# Patient Record
Sex: Female | Born: 1945
Health system: Southern US, Community
[De-identification: ages and names within clinical notes are randomized; demographics above are authoritative.]

## PROBLEM LIST (undated history)

## (undated) DIAGNOSIS — N763 Subacute and chronic vulvitis: Secondary | ICD-10-CM

## (undated) DIAGNOSIS — K635 Polyp of colon: Secondary | ICD-10-CM

## (undated) DIAGNOSIS — C4490 Unspecified malignant neoplasm of skin, unspecified: Secondary | ICD-10-CM

## (undated) DIAGNOSIS — K449 Diaphragmatic hernia without obstruction or gangrene: Secondary | ICD-10-CM

## (undated) DIAGNOSIS — K2970 Gastritis, unspecified, without bleeding: Secondary | ICD-10-CM

## (undated) DIAGNOSIS — E785 Hyperlipidemia, unspecified: Secondary | ICD-10-CM

## (undated) DIAGNOSIS — R55 Syncope and collapse: Secondary | ICD-10-CM

## (undated) DIAGNOSIS — N393 Stress incontinence (female) (male): Secondary | ICD-10-CM

## (undated) DIAGNOSIS — D649 Anemia, unspecified: Secondary | ICD-10-CM

## (undated) DIAGNOSIS — I10 Essential (primary) hypertension: Secondary | ICD-10-CM

## (undated) DIAGNOSIS — I739 Peripheral vascular disease, unspecified: Secondary | ICD-10-CM

## (undated) DIAGNOSIS — M71129 Other infective bursitis, unspecified elbow: Secondary | ICD-10-CM

## (undated) DIAGNOSIS — F329 Major depressive disorder, single episode, unspecified: Secondary | ICD-10-CM

## (undated) DIAGNOSIS — E119 Type 2 diabetes mellitus without complications: Secondary | ICD-10-CM

## (undated) DIAGNOSIS — G629 Polyneuropathy, unspecified: Secondary | ICD-10-CM

## (undated) DIAGNOSIS — K219 Gastro-esophageal reflux disease without esophagitis: Secondary | ICD-10-CM

## (undated) DIAGNOSIS — G4733 Obstructive sleep apnea (adult) (pediatric): Secondary | ICD-10-CM

## (undated) DIAGNOSIS — M1990 Unspecified osteoarthritis, unspecified site: Secondary | ICD-10-CM

## (undated) DIAGNOSIS — G4730 Sleep apnea, unspecified: Secondary | ICD-10-CM

## (undated) DIAGNOSIS — G250 Essential tremor: Secondary | ICD-10-CM

## (undated) HISTORY — DX: Other infective bursitis, unspecified elbow: M71.129

## (undated) HISTORY — DX: Stress incontinence (female) (male): N39.3

## (undated) HISTORY — DX: Polyneuropathy, unspecified: G62.9

## (undated) HISTORY — DX: Diaphragmatic hernia without obstruction or gangrene: K44.9

## (undated) HISTORY — PX: ABDOMINAL HYSTERECTOMY: SUR658

## (undated) HISTORY — DX: Gastritis, unspecified, without bleeding: K29.70

## (undated) HISTORY — DX: Unspecified osteoarthritis, unspecified site: M19.90

## (undated) HISTORY — PX: CHOLECYSTECTOMY: SHX55

## (undated) HISTORY — DX: Peripheral vascular disease, unspecified: I73.9

## (undated) HISTORY — DX: Obstructive sleep apnea (adult) (pediatric): G47.33

## (undated) HISTORY — DX: Syncope and collapse: R55

## (undated) HISTORY — PX: BLADDER SURGERY: SHX569

## (undated) HISTORY — PX: PARAESOPHAGEAL HERNIA REPAIR: SHX2161

## (undated) HISTORY — DX: Major depressive disorder, single episode, unspecified: F32.9

## (undated) HISTORY — DX: Unspecified malignant neoplasm of skin, unspecified: C44.90

## (undated) HISTORY — DX: Essential (primary) hypertension: I10

## (undated) HISTORY — PX: UPPER GASTROINTESTINAL ENDOSCOPY: SHX188

## (undated) HISTORY — PX: RECTOCELE REPAIR: SHX761

## (undated) HISTORY — DX: Subacute and chronic vulvitis: N76.3

## (undated) HISTORY — DX: Hyperlipidemia, unspecified: E78.5

## (undated) HISTORY — DX: Gastro-esophageal reflux disease without esophagitis: K21.9

## (undated) HISTORY — DX: Type 2 diabetes mellitus without complications: E11.9

## (undated) HISTORY — DX: Polyp of colon: K63.5

## (undated) HISTORY — DX: Anemia, unspecified: D64.9

---
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
## 2008-07-19 DIAGNOSIS — G4730 Sleep apnea, unspecified: Secondary | ICD-10-CM | POA: Insufficient documentation

## 2008-07-19 HISTORY — DX: Sleep apnea, unspecified: G47.30

## 2010-07-07 ENCOUNTER — Ambulatory Visit: Payer: Self-pay | Admitting: Unknown Physician Specialty

## 2011-09-13 DIAGNOSIS — Z0000 Encounter for general adult medical examination without abnormal findings: Secondary | ICD-10-CM | POA: Diagnosis not present

## 2011-09-28 DIAGNOSIS — G250 Essential tremor: Secondary | ICD-10-CM | POA: Diagnosis not present

## 2011-09-28 DIAGNOSIS — R5381 Other malaise: Secondary | ICD-10-CM | POA: Diagnosis not present

## 2011-09-28 DIAGNOSIS — G252 Other specified forms of tremor: Secondary | ICD-10-CM | POA: Diagnosis not present

## 2011-09-28 DIAGNOSIS — E669 Obesity, unspecified: Secondary | ICD-10-CM | POA: Diagnosis not present

## 2011-10-08 DIAGNOSIS — E782 Mixed hyperlipidemia: Secondary | ICD-10-CM | POA: Diagnosis not present

## 2011-10-08 DIAGNOSIS — I10 Essential (primary) hypertension: Secondary | ICD-10-CM | POA: Diagnosis not present

## 2011-10-08 DIAGNOSIS — F329 Major depressive disorder, single episode, unspecified: Secondary | ICD-10-CM | POA: Diagnosis not present

## 2011-11-12 DIAGNOSIS — Z01419 Encounter for gynecological examination (general) (routine) without abnormal findings: Secondary | ICD-10-CM | POA: Diagnosis not present

## 2011-11-12 DIAGNOSIS — Z124 Encounter for screening for malignant neoplasm of cervix: Secondary | ICD-10-CM | POA: Diagnosis not present

## 2011-11-15 DIAGNOSIS — N951 Menopausal and female climacteric states: Secondary | ICD-10-CM | POA: Diagnosis not present

## 2011-11-17 DIAGNOSIS — Z1231 Encounter for screening mammogram for malignant neoplasm of breast: Secondary | ICD-10-CM | POA: Diagnosis not present

## 2011-11-18 DIAGNOSIS — M899 Disorder of bone, unspecified: Secondary | ICD-10-CM | POA: Diagnosis not present

## 2011-11-18 DIAGNOSIS — M949 Disorder of cartilage, unspecified: Secondary | ICD-10-CM | POA: Diagnosis not present

## 2011-11-23 DIAGNOSIS — J029 Acute pharyngitis, unspecified: Secondary | ICD-10-CM | POA: Diagnosis not present

## 2012-03-02 DIAGNOSIS — G250 Essential tremor: Secondary | ICD-10-CM | POA: Diagnosis not present

## 2012-03-02 DIAGNOSIS — G252 Other specified forms of tremor: Secondary | ICD-10-CM | POA: Diagnosis not present

## 2012-04-11 DIAGNOSIS — M19019 Primary osteoarthritis, unspecified shoulder: Secondary | ICD-10-CM | POA: Diagnosis not present

## 2012-04-11 DIAGNOSIS — M47814 Spondylosis without myelopathy or radiculopathy, thoracic region: Secondary | ICD-10-CM | POA: Diagnosis not present

## 2012-04-11 DIAGNOSIS — I10 Essential (primary) hypertension: Secondary | ICD-10-CM | POA: Diagnosis not present

## 2012-04-11 DIAGNOSIS — IMO0002 Reserved for concepts with insufficient information to code with codable children: Secondary | ICD-10-CM | POA: Diagnosis not present

## 2012-04-11 DIAGNOSIS — M47812 Spondylosis without myelopathy or radiculopathy, cervical region: Secondary | ICD-10-CM | POA: Diagnosis not present

## 2012-04-14 DIAGNOSIS — M6281 Muscle weakness (generalized): Secondary | ICD-10-CM | POA: Diagnosis not present

## 2012-04-14 DIAGNOSIS — M47812 Spondylosis without myelopathy or radiculopathy, cervical region: Secondary | ICD-10-CM | POA: Diagnosis not present

## 2012-04-14 DIAGNOSIS — IMO0001 Reserved for inherently not codable concepts without codable children: Secondary | ICD-10-CM | POA: Diagnosis not present

## 2012-04-14 DIAGNOSIS — M542 Cervicalgia: Secondary | ICD-10-CM | POA: Diagnosis not present

## 2012-04-25 DIAGNOSIS — R1310 Dysphagia, unspecified: Secondary | ICD-10-CM | POA: Diagnosis not present

## 2012-04-25 DIAGNOSIS — IMO0002 Reserved for concepts with insufficient information to code with codable children: Secondary | ICD-10-CM | POA: Diagnosis not present

## 2012-04-25 DIAGNOSIS — M47814 Spondylosis without myelopathy or radiculopathy, thoracic region: Secondary | ICD-10-CM | POA: Diagnosis not present

## 2012-04-26 DIAGNOSIS — M47814 Spondylosis without myelopathy or radiculopathy, thoracic region: Secondary | ICD-10-CM | POA: Diagnosis not present

## 2012-04-26 DIAGNOSIS — M6281 Muscle weakness (generalized): Secondary | ICD-10-CM | POA: Diagnosis not present

## 2012-04-26 DIAGNOSIS — M47812 Spondylosis without myelopathy or radiculopathy, cervical region: Secondary | ICD-10-CM | POA: Diagnosis not present

## 2012-04-26 DIAGNOSIS — M5124 Other intervertebral disc displacement, thoracic region: Secondary | ICD-10-CM | POA: Diagnosis not present

## 2012-04-26 DIAGNOSIS — M542 Cervicalgia: Secondary | ICD-10-CM | POA: Diagnosis not present

## 2012-04-26 DIAGNOSIS — IMO0001 Reserved for inherently not codable concepts without codable children: Secondary | ICD-10-CM | POA: Diagnosis not present

## 2012-04-26 DIAGNOSIS — IMO0002 Reserved for concepts with insufficient information to code with codable children: Secondary | ICD-10-CM | POA: Diagnosis not present

## 2012-04-28 DIAGNOSIS — R1314 Dysphagia, pharyngoesophageal phase: Secondary | ICD-10-CM | POA: Diagnosis not present

## 2012-04-28 DIAGNOSIS — K449 Diaphragmatic hernia without obstruction or gangrene: Secondary | ICD-10-CM | POA: Diagnosis not present

## 2012-04-28 DIAGNOSIS — K224 Dyskinesia of esophagus: Secondary | ICD-10-CM | POA: Diagnosis not present

## 2012-04-28 HISTORY — PX: ESOPHAGOGASTRODUODENOSCOPY: SHX1529

## 2012-05-02 DIAGNOSIS — M545 Low back pain: Secondary | ICD-10-CM | POA: Diagnosis not present

## 2012-05-02 DIAGNOSIS — M542 Cervicalgia: Secondary | ICD-10-CM | POA: Diagnosis not present

## 2012-05-02 DIAGNOSIS — M47814 Spondylosis without myelopathy or radiculopathy, thoracic region: Secondary | ICD-10-CM | POA: Diagnosis not present

## 2012-05-02 DIAGNOSIS — IMO0002 Reserved for concepts with insufficient information to code with codable children: Secondary | ICD-10-CM | POA: Diagnosis not present

## 2012-05-02 DIAGNOSIS — M47812 Spondylosis without myelopathy or radiculopathy, cervical region: Secondary | ICD-10-CM | POA: Diagnosis not present

## 2012-05-02 DIAGNOSIS — M6281 Muscle weakness (generalized): Secondary | ICD-10-CM | POA: Diagnosis not present

## 2012-05-02 DIAGNOSIS — IMO0001 Reserved for inherently not codable concepts without codable children: Secondary | ICD-10-CM | POA: Diagnosis not present

## 2012-05-05 DIAGNOSIS — IMO0001 Reserved for inherently not codable concepts without codable children: Secondary | ICD-10-CM | POA: Diagnosis not present

## 2012-05-05 DIAGNOSIS — M47812 Spondylosis without myelopathy or radiculopathy, cervical region: Secondary | ICD-10-CM | POA: Diagnosis not present

## 2012-05-05 DIAGNOSIS — IMO0002 Reserved for concepts with insufficient information to code with codable children: Secondary | ICD-10-CM | POA: Diagnosis not present

## 2012-05-05 DIAGNOSIS — M47814 Spondylosis without myelopathy or radiculopathy, thoracic region: Secondary | ICD-10-CM | POA: Diagnosis not present

## 2012-05-05 DIAGNOSIS — M6281 Muscle weakness (generalized): Secondary | ICD-10-CM | POA: Diagnosis not present

## 2012-05-05 DIAGNOSIS — M542 Cervicalgia: Secondary | ICD-10-CM | POA: Diagnosis not present

## 2012-05-08 DIAGNOSIS — IMO0001 Reserved for inherently not codable concepts without codable children: Secondary | ICD-10-CM | POA: Diagnosis not present

## 2012-05-08 DIAGNOSIS — IMO0002 Reserved for concepts with insufficient information to code with codable children: Secondary | ICD-10-CM | POA: Diagnosis not present

## 2012-05-08 DIAGNOSIS — M542 Cervicalgia: Secondary | ICD-10-CM | POA: Diagnosis not present

## 2012-05-08 DIAGNOSIS — M47812 Spondylosis without myelopathy or radiculopathy, cervical region: Secondary | ICD-10-CM | POA: Diagnosis not present

## 2012-05-08 DIAGNOSIS — M6281 Muscle weakness (generalized): Secondary | ICD-10-CM | POA: Diagnosis not present

## 2012-05-08 DIAGNOSIS — M47814 Spondylosis without myelopathy or radiculopathy, thoracic region: Secondary | ICD-10-CM | POA: Diagnosis not present

## 2012-05-11 DIAGNOSIS — IMO0002 Reserved for concepts with insufficient information to code with codable children: Secondary | ICD-10-CM | POA: Diagnosis not present

## 2012-05-11 DIAGNOSIS — M47814 Spondylosis without myelopathy or radiculopathy, thoracic region: Secondary | ICD-10-CM | POA: Diagnosis not present

## 2012-05-11 DIAGNOSIS — M542 Cervicalgia: Secondary | ICD-10-CM | POA: Diagnosis not present

## 2012-05-11 DIAGNOSIS — M6281 Muscle weakness (generalized): Secondary | ICD-10-CM | POA: Diagnosis not present

## 2012-05-11 DIAGNOSIS — M47812 Spondylosis without myelopathy or radiculopathy, cervical region: Secondary | ICD-10-CM | POA: Diagnosis not present

## 2012-05-11 DIAGNOSIS — IMO0001 Reserved for inherently not codable concepts without codable children: Secondary | ICD-10-CM | POA: Diagnosis not present

## 2012-05-18 DIAGNOSIS — M47814 Spondylosis without myelopathy or radiculopathy, thoracic region: Secondary | ICD-10-CM | POA: Diagnosis not present

## 2012-05-18 DIAGNOSIS — M542 Cervicalgia: Secondary | ICD-10-CM | POA: Diagnosis not present

## 2012-05-18 DIAGNOSIS — M6281 Muscle weakness (generalized): Secondary | ICD-10-CM | POA: Diagnosis not present

## 2012-05-18 DIAGNOSIS — IMO0001 Reserved for inherently not codable concepts without codable children: Secondary | ICD-10-CM | POA: Diagnosis not present

## 2012-05-18 DIAGNOSIS — M47812 Spondylosis without myelopathy or radiculopathy, cervical region: Secondary | ICD-10-CM | POA: Diagnosis not present

## 2012-05-18 DIAGNOSIS — IMO0002 Reserved for concepts with insufficient information to code with codable children: Secondary | ICD-10-CM | POA: Diagnosis not present

## 2012-05-22 DIAGNOSIS — M542 Cervicalgia: Secondary | ICD-10-CM | POA: Diagnosis not present

## 2012-05-24 DIAGNOSIS — M542 Cervicalgia: Secondary | ICD-10-CM | POA: Diagnosis not present

## 2012-05-29 DIAGNOSIS — K2960 Other gastritis without bleeding: Secondary | ICD-10-CM | POA: Diagnosis not present

## 2012-05-29 DIAGNOSIS — M542 Cervicalgia: Secondary | ICD-10-CM | POA: Diagnosis not present

## 2012-05-29 DIAGNOSIS — K5900 Constipation, unspecified: Secondary | ICD-10-CM | POA: Diagnosis not present

## 2012-05-30 DIAGNOSIS — M545 Low back pain: Secondary | ICD-10-CM | POA: Diagnosis not present

## 2012-05-31 DIAGNOSIS — M542 Cervicalgia: Secondary | ICD-10-CM | POA: Diagnosis not present

## 2012-06-02 DIAGNOSIS — Z23 Encounter for immunization: Secondary | ICD-10-CM | POA: Diagnosis not present

## 2012-06-05 DIAGNOSIS — M542 Cervicalgia: Secondary | ICD-10-CM | POA: Diagnosis not present

## 2012-06-20 DIAGNOSIS — M546 Pain in thoracic spine: Secondary | ICD-10-CM | POA: Diagnosis not present

## 2012-06-29 DIAGNOSIS — H8110 Benign paroxysmal vertigo, unspecified ear: Secondary | ICD-10-CM | POA: Diagnosis not present

## 2012-06-29 DIAGNOSIS — Z6830 Body mass index (BMI) 30.0-30.9, adult: Secondary | ICD-10-CM | POA: Diagnosis not present

## 2012-06-29 DIAGNOSIS — M949 Disorder of cartilage, unspecified: Secondary | ICD-10-CM | POA: Diagnosis not present

## 2012-06-29 DIAGNOSIS — R51 Headache: Secondary | ICD-10-CM | POA: Diagnosis not present

## 2012-06-29 DIAGNOSIS — M899 Disorder of bone, unspecified: Secondary | ICD-10-CM | POA: Diagnosis not present

## 2012-07-01 DIAGNOSIS — R220 Localized swelling, mass and lump, head: Secondary | ICD-10-CM | POA: Diagnosis not present

## 2012-07-01 DIAGNOSIS — R42 Dizziness and giddiness: Secondary | ICD-10-CM | POA: Diagnosis not present

## 2012-07-01 DIAGNOSIS — R221 Localized swelling, mass and lump, neck: Secondary | ICD-10-CM | POA: Diagnosis not present

## 2012-07-01 DIAGNOSIS — R51 Headache: Secondary | ICD-10-CM | POA: Diagnosis not present

## 2012-11-22 DIAGNOSIS — H9120 Sudden idiopathic hearing loss, unspecified ear: Secondary | ICD-10-CM | POA: Diagnosis not present

## 2012-11-22 DIAGNOSIS — H905 Unspecified sensorineural hearing loss: Secondary | ICD-10-CM | POA: Diagnosis not present

## 2012-12-06 DIAGNOSIS — H905 Unspecified sensorineural hearing loss: Secondary | ICD-10-CM | POA: Diagnosis not present

## 2012-12-06 DIAGNOSIS — H9120 Sudden idiopathic hearing loss, unspecified ear: Secondary | ICD-10-CM | POA: Diagnosis not present

## 2012-12-14 DIAGNOSIS — A09 Infectious gastroenteritis and colitis, unspecified: Secondary | ICD-10-CM | POA: Diagnosis not present

## 2012-12-14 DIAGNOSIS — Z6830 Body mass index (BMI) 30.0-30.9, adult: Secondary | ICD-10-CM | POA: Diagnosis not present

## 2012-12-19 ENCOUNTER — Ambulatory Visit: Payer: Self-pay | Admitting: Unknown Physician Specialty

## 2012-12-19 DIAGNOSIS — H9120 Sudden idiopathic hearing loss, unspecified ear: Secondary | ICD-10-CM | POA: Diagnosis not present

## 2012-12-19 DIAGNOSIS — Z1389 Encounter for screening for other disorder: Secondary | ICD-10-CM | POA: Diagnosis not present

## 2012-12-19 DIAGNOSIS — H9190 Unspecified hearing loss, unspecified ear: Secondary | ICD-10-CM | POA: Diagnosis not present

## 2012-12-19 LAB — CREATININE, SERUM
EGFR (African American): 42 — ABNORMAL LOW
EGFR (Non-African Amer.): 37 — ABNORMAL LOW

## 2013-04-30 DIAGNOSIS — K219 Gastro-esophageal reflux disease without esophagitis: Secondary | ICD-10-CM | POA: Diagnosis not present

## 2013-04-30 DIAGNOSIS — Z86010 Personal history of colonic polyps: Secondary | ICD-10-CM | POA: Diagnosis not present

## 2013-05-28 DIAGNOSIS — Z86010 Personal history of colonic polyps: Secondary | ICD-10-CM | POA: Diagnosis not present

## 2013-05-28 HISTORY — PX: COLONOSCOPY: SHX174

## 2013-06-01 DIAGNOSIS — K4090 Unilateral inguinal hernia, without obstruction or gangrene, not specified as recurrent: Secondary | ICD-10-CM | POA: Diagnosis not present

## 2013-06-01 DIAGNOSIS — K4020 Bilateral inguinal hernia, without obstruction or gangrene, not specified as recurrent: Secondary | ICD-10-CM | POA: Diagnosis not present

## 2013-06-01 DIAGNOSIS — R109 Unspecified abdominal pain: Secondary | ICD-10-CM | POA: Diagnosis not present

## 2013-06-01 DIAGNOSIS — K449 Diaphragmatic hernia without obstruction or gangrene: Secondary | ICD-10-CM | POA: Diagnosis not present

## 2013-06-04 DIAGNOSIS — R1032 Left lower quadrant pain: Secondary | ICD-10-CM | POA: Diagnosis not present

## 2013-06-22 DIAGNOSIS — D509 Iron deficiency anemia, unspecified: Secondary | ICD-10-CM | POA: Diagnosis not present

## 2013-06-22 DIAGNOSIS — R7301 Impaired fasting glucose: Secondary | ICD-10-CM | POA: Diagnosis not present

## 2013-06-22 DIAGNOSIS — D539 Nutritional anemia, unspecified: Secondary | ICD-10-CM | POA: Diagnosis not present

## 2013-06-22 DIAGNOSIS — I10 Essential (primary) hypertension: Secondary | ICD-10-CM | POA: Diagnosis not present

## 2013-06-22 DIAGNOSIS — R7309 Other abnormal glucose: Secondary | ICD-10-CM | POA: Diagnosis not present

## 2013-06-22 DIAGNOSIS — C4490 Unspecified malignant neoplasm of skin, unspecified: Secondary | ICD-10-CM | POA: Diagnosis not present

## 2013-07-02 DIAGNOSIS — E119 Type 2 diabetes mellitus without complications: Secondary | ICD-10-CM | POA: Diagnosis not present

## 2013-07-04 DIAGNOSIS — D509 Iron deficiency anemia, unspecified: Secondary | ICD-10-CM | POA: Diagnosis not present

## 2013-07-04 DIAGNOSIS — Z6832 Body mass index (BMI) 32.0-32.9, adult: Secondary | ICD-10-CM | POA: Diagnosis not present

## 2013-07-04 DIAGNOSIS — E119 Type 2 diabetes mellitus without complications: Secondary | ICD-10-CM | POA: Diagnosis not present

## 2013-07-04 DIAGNOSIS — E785 Hyperlipidemia, unspecified: Secondary | ICD-10-CM | POA: Diagnosis not present

## 2013-07-10 DIAGNOSIS — Z1231 Encounter for screening mammogram for malignant neoplasm of breast: Secondary | ICD-10-CM | POA: Diagnosis not present

## 2013-07-11 DIAGNOSIS — Z1231 Encounter for screening mammogram for malignant neoplasm of breast: Secondary | ICD-10-CM | POA: Diagnosis not present

## 2013-07-25 DIAGNOSIS — K219 Gastro-esophageal reflux disease without esophagitis: Secondary | ICD-10-CM | POA: Diagnosis not present

## 2013-07-25 DIAGNOSIS — D509 Iron deficiency anemia, unspecified: Secondary | ICD-10-CM | POA: Diagnosis not present

## 2013-07-25 DIAGNOSIS — K5900 Constipation, unspecified: Secondary | ICD-10-CM | POA: Diagnosis not present

## 2013-07-30 DIAGNOSIS — G609 Hereditary and idiopathic neuropathy, unspecified: Secondary | ICD-10-CM | POA: Diagnosis not present

## 2013-07-30 DIAGNOSIS — I10 Essential (primary) hypertension: Secondary | ICD-10-CM | POA: Diagnosis not present

## 2013-07-30 DIAGNOSIS — E119 Type 2 diabetes mellitus without complications: Secondary | ICD-10-CM | POA: Diagnosis not present

## 2013-07-30 DIAGNOSIS — K219 Gastro-esophageal reflux disease without esophagitis: Secondary | ICD-10-CM | POA: Diagnosis not present

## 2013-07-30 DIAGNOSIS — Z713 Dietary counseling and surveillance: Secondary | ICD-10-CM | POA: Diagnosis not present

## 2013-07-30 DIAGNOSIS — E785 Hyperlipidemia, unspecified: Secondary | ICD-10-CM | POA: Diagnosis not present

## 2013-07-30 DIAGNOSIS — Z79899 Other long term (current) drug therapy: Secondary | ICD-10-CM | POA: Diagnosis not present

## 2013-07-30 DIAGNOSIS — D509 Iron deficiency anemia, unspecified: Secondary | ICD-10-CM | POA: Diagnosis not present

## 2013-08-02 DIAGNOSIS — D649 Anemia, unspecified: Secondary | ICD-10-CM | POA: Diagnosis not present

## 2013-08-03 DIAGNOSIS — D509 Iron deficiency anemia, unspecified: Secondary | ICD-10-CM | POA: Diagnosis not present

## 2013-08-03 DIAGNOSIS — K449 Diaphragmatic hernia without obstruction or gangrene: Secondary | ICD-10-CM | POA: Diagnosis not present

## 2013-08-03 DIAGNOSIS — Z79899 Other long term (current) drug therapy: Secondary | ICD-10-CM | POA: Diagnosis not present

## 2013-08-03 DIAGNOSIS — K219 Gastro-esophageal reflux disease without esophagitis: Secondary | ICD-10-CM | POA: Diagnosis not present

## 2013-08-03 DIAGNOSIS — D131 Benign neoplasm of stomach: Secondary | ICD-10-CM | POA: Diagnosis not present

## 2013-08-03 DIAGNOSIS — D500 Iron deficiency anemia secondary to blood loss (chronic): Secondary | ICD-10-CM | POA: Diagnosis not present

## 2013-08-03 DIAGNOSIS — K2970 Gastritis, unspecified, without bleeding: Secondary | ICD-10-CM | POA: Diagnosis not present

## 2013-08-03 DIAGNOSIS — G609 Hereditary and idiopathic neuropathy, unspecified: Secondary | ICD-10-CM | POA: Diagnosis not present

## 2013-08-03 DIAGNOSIS — E119 Type 2 diabetes mellitus without complications: Secondary | ICD-10-CM | POA: Diagnosis not present

## 2013-08-03 DIAGNOSIS — K2990 Gastroduodenitis, unspecified, without bleeding: Secondary | ICD-10-CM | POA: Diagnosis not present

## 2013-08-07 DIAGNOSIS — D509 Iron deficiency anemia, unspecified: Secondary | ICD-10-CM | POA: Diagnosis not present

## 2013-08-17 DIAGNOSIS — E119 Type 2 diabetes mellitus without complications: Secondary | ICD-10-CM | POA: Diagnosis not present

## 2013-08-30 DIAGNOSIS — K2971 Gastritis, unspecified, with bleeding: Secondary | ICD-10-CM | POA: Diagnosis not present

## 2013-08-30 DIAGNOSIS — D500 Iron deficiency anemia secondary to blood loss (chronic): Secondary | ICD-10-CM | POA: Diagnosis not present

## 2013-08-30 DIAGNOSIS — Z09 Encounter for follow-up examination after completed treatment for conditions other than malignant neoplasm: Secondary | ICD-10-CM | POA: Diagnosis not present

## 2013-08-30 DIAGNOSIS — K2991 Gastroduodenitis, unspecified, with bleeding: Secondary | ICD-10-CM | POA: Diagnosis not present

## 2013-09-03 DIAGNOSIS — Z86010 Personal history of colonic polyps: Secondary | ICD-10-CM | POA: Diagnosis not present

## 2013-09-03 DIAGNOSIS — K2970 Gastritis, unspecified, without bleeding: Secondary | ICD-10-CM | POA: Diagnosis not present

## 2013-09-03 DIAGNOSIS — D509 Iron deficiency anemia, unspecified: Secondary | ICD-10-CM | POA: Diagnosis not present

## 2013-09-03 DIAGNOSIS — K219 Gastro-esophageal reflux disease without esophagitis: Secondary | ICD-10-CM | POA: Diagnosis not present

## 2013-10-04 DIAGNOSIS — E785 Hyperlipidemia, unspecified: Secondary | ICD-10-CM | POA: Diagnosis not present

## 2013-10-04 DIAGNOSIS — Z9181 History of falling: Secondary | ICD-10-CM | POA: Diagnosis not present

## 2013-10-04 DIAGNOSIS — E119 Type 2 diabetes mellitus without complications: Secondary | ICD-10-CM | POA: Diagnosis not present

## 2013-10-04 DIAGNOSIS — D509 Iron deficiency anemia, unspecified: Secondary | ICD-10-CM | POA: Diagnosis not present

## 2013-10-04 DIAGNOSIS — Z6831 Body mass index (BMI) 31.0-31.9, adult: Secondary | ICD-10-CM | POA: Diagnosis not present

## 2013-10-04 DIAGNOSIS — Z1331 Encounter for screening for depression: Secondary | ICD-10-CM | POA: Diagnosis not present

## 2013-10-15 DIAGNOSIS — IMO0002 Reserved for concepts with insufficient information to code with codable children: Secondary | ICD-10-CM | POA: Diagnosis not present

## 2013-10-15 DIAGNOSIS — M1710 Unilateral primary osteoarthritis, unspecified knee: Secondary | ICD-10-CM | POA: Diagnosis not present

## 2013-10-15 DIAGNOSIS — M25569 Pain in unspecified knee: Secondary | ICD-10-CM | POA: Diagnosis not present

## 2013-10-24 DIAGNOSIS — D649 Anemia, unspecified: Secondary | ICD-10-CM | POA: Diagnosis not present

## 2013-10-24 DIAGNOSIS — D509 Iron deficiency anemia, unspecified: Secondary | ICD-10-CM | POA: Diagnosis not present

## 2014-01-07 DIAGNOSIS — Z6832 Body mass index (BMI) 32.0-32.9, adult: Secondary | ICD-10-CM | POA: Diagnosis not present

## 2014-01-07 DIAGNOSIS — D509 Iron deficiency anemia, unspecified: Secondary | ICD-10-CM | POA: Diagnosis not present

## 2014-01-07 DIAGNOSIS — E1149 Type 2 diabetes mellitus with other diabetic neurological complication: Secondary | ICD-10-CM | POA: Diagnosis not present

## 2014-01-07 DIAGNOSIS — E785 Hyperlipidemia, unspecified: Secondary | ICD-10-CM | POA: Diagnosis not present

## 2014-03-07 DIAGNOSIS — C4442 Squamous cell carcinoma of skin of scalp and neck: Secondary | ICD-10-CM | POA: Diagnosis not present

## 2014-03-07 DIAGNOSIS — L570 Actinic keratosis: Secondary | ICD-10-CM | POA: Diagnosis not present

## 2014-03-13 DIAGNOSIS — C4441 Basal cell carcinoma of skin of scalp and neck: Secondary | ICD-10-CM | POA: Diagnosis not present

## 2014-03-28 DIAGNOSIS — L821 Other seborrheic keratosis: Secondary | ICD-10-CM | POA: Diagnosis not present

## 2014-03-28 DIAGNOSIS — C4441 Basal cell carcinoma of skin of scalp and neck: Secondary | ICD-10-CM | POA: Diagnosis not present

## 2014-03-28 DIAGNOSIS — L820 Inflamed seborrheic keratosis: Secondary | ICD-10-CM | POA: Diagnosis not present

## 2014-04-01 DIAGNOSIS — N390 Urinary tract infection, site not specified: Secondary | ICD-10-CM | POA: Diagnosis not present

## 2014-04-01 DIAGNOSIS — N3941 Urge incontinence: Secondary | ICD-10-CM | POA: Diagnosis not present

## 2014-04-04 DIAGNOSIS — M5137 Other intervertebral disc degeneration, lumbosacral region: Secondary | ICD-10-CM | POA: Diagnosis not present

## 2014-04-04 DIAGNOSIS — M47817 Spondylosis without myelopathy or radiculopathy, lumbosacral region: Secondary | ICD-10-CM | POA: Diagnosis not present

## 2014-04-15 DIAGNOSIS — D509 Iron deficiency anemia, unspecified: Secondary | ICD-10-CM | POA: Diagnosis not present

## 2014-04-15 DIAGNOSIS — K5900 Constipation, unspecified: Secondary | ICD-10-CM | POA: Diagnosis not present

## 2014-04-15 DIAGNOSIS — Z6832 Body mass index (BMI) 32.0-32.9, adult: Secondary | ICD-10-CM | POA: Diagnosis not present

## 2014-04-15 DIAGNOSIS — E785 Hyperlipidemia, unspecified: Secondary | ICD-10-CM | POA: Diagnosis not present

## 2014-04-15 DIAGNOSIS — E1149 Type 2 diabetes mellitus with other diabetic neurological complication: Secondary | ICD-10-CM | POA: Diagnosis not present

## 2014-04-15 DIAGNOSIS — N318 Other neuromuscular dysfunction of bladder: Secondary | ICD-10-CM | POA: Diagnosis not present

## 2014-04-15 DIAGNOSIS — I10 Essential (primary) hypertension: Secondary | ICD-10-CM | POA: Diagnosis not present

## 2014-04-17 DIAGNOSIS — G603 Idiopathic progressive neuropathy: Secondary | ICD-10-CM | POA: Diagnosis not present

## 2014-04-29 DIAGNOSIS — K5900 Constipation, unspecified: Secondary | ICD-10-CM | POA: Diagnosis not present

## 2014-04-29 DIAGNOSIS — N3281 Overactive bladder: Secondary | ICD-10-CM | POA: Diagnosis not present

## 2014-04-29 DIAGNOSIS — Z23 Encounter for immunization: Secondary | ICD-10-CM | POA: Diagnosis not present

## 2014-05-02 DIAGNOSIS — R200 Anesthesia of skin: Secondary | ICD-10-CM | POA: Diagnosis not present

## 2014-05-17 DIAGNOSIS — Z13820 Encounter for screening for osteoporosis: Secondary | ICD-10-CM | POA: Diagnosis not present

## 2014-05-17 DIAGNOSIS — M8588 Other specified disorders of bone density and structure, other site: Secondary | ICD-10-CM | POA: Diagnosis not present

## 2014-06-05 DIAGNOSIS — G250 Essential tremor: Secondary | ICD-10-CM | POA: Diagnosis not present

## 2014-06-05 DIAGNOSIS — G609 Hereditary and idiopathic neuropathy, unspecified: Secondary | ICD-10-CM | POA: Diagnosis not present

## 2014-06-06 DIAGNOSIS — N952 Postmenopausal atrophic vaginitis: Secondary | ICD-10-CM | POA: Diagnosis not present

## 2014-06-06 DIAGNOSIS — N3281 Overactive bladder: Secondary | ICD-10-CM | POA: Diagnosis not present

## 2014-07-23 DIAGNOSIS — Z6832 Body mass index (BMI) 32.0-32.9, adult: Secondary | ICD-10-CM | POA: Diagnosis not present

## 2014-07-23 DIAGNOSIS — I10 Essential (primary) hypertension: Secondary | ICD-10-CM | POA: Diagnosis not present

## 2014-07-23 DIAGNOSIS — E1140 Type 2 diabetes mellitus with diabetic neuropathy, unspecified: Secondary | ICD-10-CM | POA: Diagnosis not present

## 2014-07-23 DIAGNOSIS — Z1231 Encounter for screening mammogram for malignant neoplasm of breast: Secondary | ICD-10-CM | POA: Diagnosis not present

## 2014-07-23 DIAGNOSIS — E611 Iron deficiency: Secondary | ICD-10-CM | POA: Diagnosis not present

## 2014-07-23 DIAGNOSIS — E785 Hyperlipidemia, unspecified: Secondary | ICD-10-CM | POA: Diagnosis not present

## 2014-07-30 DIAGNOSIS — Z1231 Encounter for screening mammogram for malignant neoplasm of breast: Secondary | ICD-10-CM | POA: Diagnosis not present

## 2014-08-15 DIAGNOSIS — E119 Type 2 diabetes mellitus without complications: Secondary | ICD-10-CM | POA: Diagnosis not present

## 2014-08-26 DIAGNOSIS — H903 Sensorineural hearing loss, bilateral: Secondary | ICD-10-CM | POA: Diagnosis not present

## 2014-10-31 DIAGNOSIS — I10 Essential (primary) hypertension: Secondary | ICD-10-CM | POA: Diagnosis not present

## 2014-10-31 DIAGNOSIS — Z1389 Encounter for screening for other disorder: Secondary | ICD-10-CM | POA: Diagnosis not present

## 2014-10-31 DIAGNOSIS — E1142 Type 2 diabetes mellitus with diabetic polyneuropathy: Secondary | ICD-10-CM | POA: Diagnosis not present

## 2014-10-31 DIAGNOSIS — Z9181 History of falling: Secondary | ICD-10-CM | POA: Diagnosis not present

## 2014-10-31 DIAGNOSIS — Z6832 Body mass index (BMI) 32.0-32.9, adult: Secondary | ICD-10-CM | POA: Diagnosis not present

## 2014-10-31 DIAGNOSIS — E785 Hyperlipidemia, unspecified: Secondary | ICD-10-CM | POA: Diagnosis not present

## 2014-10-31 DIAGNOSIS — E611 Iron deficiency: Secondary | ICD-10-CM | POA: Diagnosis not present

## 2014-10-31 DIAGNOSIS — Z23 Encounter for immunization: Secondary | ICD-10-CM | POA: Diagnosis not present

## 2014-11-20 DIAGNOSIS — G609 Hereditary and idiopathic neuropathy, unspecified: Secondary | ICD-10-CM | POA: Diagnosis not present

## 2015-01-04 DIAGNOSIS — Z6831 Body mass index (BMI) 31.0-31.9, adult: Secondary | ICD-10-CM | POA: Diagnosis not present

## 2015-01-04 DIAGNOSIS — S80869A Insect bite (nonvenomous), unspecified lower leg, initial encounter: Secondary | ICD-10-CM | POA: Diagnosis not present

## 2015-01-31 DIAGNOSIS — K5909 Other constipation: Secondary | ICD-10-CM | POA: Diagnosis not present

## 2015-01-31 DIAGNOSIS — Z6831 Body mass index (BMI) 31.0-31.9, adult: Secondary | ICD-10-CM | POA: Diagnosis not present

## 2015-01-31 DIAGNOSIS — N8110 Cystocele, unspecified: Secondary | ICD-10-CM | POA: Diagnosis not present

## 2015-02-06 DIAGNOSIS — Z8719 Personal history of other diseases of the digestive system: Secondary | ICD-10-CM | POA: Diagnosis not present

## 2015-02-06 DIAGNOSIS — G609 Hereditary and idiopathic neuropathy, unspecified: Secondary | ICD-10-CM | POA: Diagnosis not present

## 2015-02-06 DIAGNOSIS — Z8669 Personal history of other diseases of the nervous system and sense organs: Secondary | ICD-10-CM | POA: Diagnosis not present

## 2015-02-06 DIAGNOSIS — N816 Rectocele: Secondary | ICD-10-CM | POA: Diagnosis not present

## 2015-02-07 DIAGNOSIS — E785 Hyperlipidemia, unspecified: Secondary | ICD-10-CM | POA: Diagnosis not present

## 2015-02-07 DIAGNOSIS — K5909 Other constipation: Secondary | ICD-10-CM | POA: Diagnosis not present

## 2015-02-07 DIAGNOSIS — E611 Iron deficiency: Secondary | ICD-10-CM | POA: Diagnosis not present

## 2015-02-07 DIAGNOSIS — E1142 Type 2 diabetes mellitus with diabetic polyneuropathy: Secondary | ICD-10-CM | POA: Diagnosis not present

## 2015-02-07 DIAGNOSIS — Z6830 Body mass index (BMI) 30.0-30.9, adult: Secondary | ICD-10-CM | POA: Diagnosis not present

## 2015-02-07 DIAGNOSIS — I10 Essential (primary) hypertension: Secondary | ICD-10-CM | POA: Diagnosis not present

## 2015-02-07 DIAGNOSIS — N816 Rectocele: Secondary | ICD-10-CM | POA: Diagnosis not present

## 2015-02-20 DIAGNOSIS — N8111 Cystocele, midline: Secondary | ICD-10-CM | POA: Diagnosis not present

## 2015-02-20 DIAGNOSIS — N816 Rectocele: Secondary | ICD-10-CM | POA: Diagnosis not present

## 2015-03-07 DIAGNOSIS — N816 Rectocele: Secondary | ICD-10-CM | POA: Diagnosis not present

## 2015-03-07 DIAGNOSIS — M8580 Other specified disorders of bone density and structure, unspecified site: Secondary | ICD-10-CM | POA: Diagnosis not present

## 2015-03-07 DIAGNOSIS — N761 Subacute and chronic vaginitis: Secondary | ICD-10-CM | POA: Diagnosis not present

## 2015-04-02 DIAGNOSIS — R5383 Other fatigue: Secondary | ICD-10-CM | POA: Diagnosis not present

## 2015-04-02 DIAGNOSIS — Z9181 History of falling: Secondary | ICD-10-CM | POA: Diagnosis not present

## 2015-04-02 DIAGNOSIS — E559 Vitamin D deficiency, unspecified: Secondary | ICD-10-CM | POA: Diagnosis not present

## 2015-04-02 DIAGNOSIS — G4733 Obstructive sleep apnea (adult) (pediatric): Secondary | ICD-10-CM | POA: Diagnosis not present

## 2015-04-02 DIAGNOSIS — E538 Deficiency of other specified B group vitamins: Secondary | ICD-10-CM | POA: Diagnosis not present

## 2015-04-02 DIAGNOSIS — D509 Iron deficiency anemia, unspecified: Secondary | ICD-10-CM | POA: Diagnosis not present

## 2015-04-25 DIAGNOSIS — N179 Acute kidney failure, unspecified: Secondary | ICD-10-CM | POA: Diagnosis not present

## 2015-04-25 DIAGNOSIS — R0902 Hypoxemia: Secondary | ICD-10-CM | POA: Diagnosis not present

## 2015-04-25 DIAGNOSIS — R071 Chest pain on breathing: Secondary | ICD-10-CM | POA: Diagnosis not present

## 2015-04-25 DIAGNOSIS — R072 Precordial pain: Secondary | ICD-10-CM | POA: Diagnosis not present

## 2015-04-25 DIAGNOSIS — K219 Gastro-esophageal reflux disease without esophagitis: Secondary | ICD-10-CM | POA: Diagnosis not present

## 2015-04-25 DIAGNOSIS — K449 Diaphragmatic hernia without obstruction or gangrene: Secondary | ICD-10-CM | POA: Diagnosis not present

## 2015-04-25 DIAGNOSIS — Z7982 Long term (current) use of aspirin: Secondary | ICD-10-CM | POA: Diagnosis not present

## 2015-04-25 DIAGNOSIS — Z79899 Other long term (current) drug therapy: Secondary | ICD-10-CM | POA: Diagnosis not present

## 2015-04-25 DIAGNOSIS — R079 Chest pain, unspecified: Secondary | ICD-10-CM | POA: Diagnosis not present

## 2015-04-25 DIAGNOSIS — I10 Essential (primary) hypertension: Secondary | ICD-10-CM | POA: Diagnosis not present

## 2015-04-25 DIAGNOSIS — I249 Acute ischemic heart disease, unspecified: Secondary | ICD-10-CM | POA: Diagnosis not present

## 2015-04-26 DIAGNOSIS — K449 Diaphragmatic hernia without obstruction or gangrene: Secondary | ICD-10-CM | POA: Diagnosis not present

## 2015-04-26 DIAGNOSIS — R0609 Other forms of dyspnea: Secondary | ICD-10-CM | POA: Diagnosis not present

## 2015-04-26 DIAGNOSIS — I10 Essential (primary) hypertension: Secondary | ICD-10-CM | POA: Diagnosis not present

## 2015-04-26 DIAGNOSIS — R0602 Shortness of breath: Secondary | ICD-10-CM | POA: Diagnosis not present

## 2015-04-26 DIAGNOSIS — N179 Acute kidney failure, unspecified: Secondary | ICD-10-CM | POA: Diagnosis not present

## 2015-04-26 DIAGNOSIS — R079 Chest pain, unspecified: Secondary | ICD-10-CM | POA: Diagnosis not present

## 2015-04-29 DIAGNOSIS — R079 Chest pain, unspecified: Secondary | ICD-10-CM | POA: Diagnosis not present

## 2015-04-29 DIAGNOSIS — Z23 Encounter for immunization: Secondary | ICD-10-CM | POA: Diagnosis not present

## 2015-04-29 DIAGNOSIS — K449 Diaphragmatic hernia without obstruction or gangrene: Secondary | ICD-10-CM | POA: Diagnosis not present

## 2015-04-29 DIAGNOSIS — Z6829 Body mass index (BMI) 29.0-29.9, adult: Secondary | ICD-10-CM | POA: Diagnosis not present

## 2015-04-29 DIAGNOSIS — N3281 Overactive bladder: Secondary | ICD-10-CM | POA: Diagnosis not present

## 2015-04-30 DIAGNOSIS — K449 Diaphragmatic hernia without obstruction or gangrene: Secondary | ICD-10-CM | POA: Diagnosis not present

## 2015-04-30 DIAGNOSIS — R079 Chest pain, unspecified: Secondary | ICD-10-CM | POA: Diagnosis not present

## 2015-04-30 DIAGNOSIS — I10 Essential (primary) hypertension: Secondary | ICD-10-CM | POA: Diagnosis not present

## 2015-04-30 DIAGNOSIS — E785 Hyperlipidemia, unspecified: Secondary | ICD-10-CM | POA: Diagnosis not present

## 2015-05-05 DIAGNOSIS — I10 Essential (primary) hypertension: Secondary | ICD-10-CM | POA: Diagnosis not present

## 2015-05-05 DIAGNOSIS — K440 Diaphragmatic hernia with obstruction, without gangrene: Secondary | ICD-10-CM | POA: Diagnosis not present

## 2015-05-05 DIAGNOSIS — K449 Diaphragmatic hernia without obstruction or gangrene: Secondary | ICD-10-CM | POA: Diagnosis not present

## 2015-05-05 DIAGNOSIS — G629 Polyneuropathy, unspecified: Secondary | ICD-10-CM | POA: Diagnosis not present

## 2015-05-05 DIAGNOSIS — K5900 Constipation, unspecified: Secondary | ICD-10-CM | POA: Diagnosis not present

## 2015-05-05 DIAGNOSIS — D174 Benign lipomatous neoplasm of intrathoracic organs: Secondary | ICD-10-CM | POA: Diagnosis not present

## 2015-05-05 DIAGNOSIS — K219 Gastro-esophageal reflux disease without esophagitis: Secondary | ICD-10-CM | POA: Diagnosis not present

## 2015-05-05 DIAGNOSIS — E119 Type 2 diabetes mellitus without complications: Secondary | ICD-10-CM | POA: Diagnosis not present

## 2015-05-05 DIAGNOSIS — Z79899 Other long term (current) drug therapy: Secondary | ICD-10-CM | POA: Diagnosis not present

## 2015-05-06 DIAGNOSIS — D174 Benign lipomatous neoplasm of intrathoracic organs: Secondary | ICD-10-CM | POA: Diagnosis not present

## 2015-05-06 DIAGNOSIS — E119 Type 2 diabetes mellitus without complications: Secondary | ICD-10-CM | POA: Diagnosis not present

## 2015-05-06 DIAGNOSIS — K440 Diaphragmatic hernia with obstruction, without gangrene: Secondary | ICD-10-CM | POA: Diagnosis not present

## 2015-05-06 DIAGNOSIS — Z79899 Other long term (current) drug therapy: Secondary | ICD-10-CM | POA: Diagnosis not present

## 2015-05-06 DIAGNOSIS — I10 Essential (primary) hypertension: Secondary | ICD-10-CM | POA: Diagnosis not present

## 2015-05-06 DIAGNOSIS — K219 Gastro-esophageal reflux disease without esophagitis: Secondary | ICD-10-CM | POA: Diagnosis not present

## 2015-05-21 DIAGNOSIS — Z789 Other specified health status: Secondary | ICD-10-CM | POA: Diagnosis not present

## 2015-05-21 DIAGNOSIS — G609 Hereditary and idiopathic neuropathy, unspecified: Secondary | ICD-10-CM | POA: Diagnosis not present

## 2015-06-19 DIAGNOSIS — R739 Hyperglycemia, unspecified: Secondary | ICD-10-CM | POA: Diagnosis not present

## 2015-07-01 DIAGNOSIS — H43393 Other vitreous opacities, bilateral: Secondary | ICD-10-CM | POA: Diagnosis not present

## 2015-07-08 DIAGNOSIS — C44319 Basal cell carcinoma of skin of other parts of face: Secondary | ICD-10-CM | POA: Diagnosis not present

## 2015-07-20 DIAGNOSIS — E119 Type 2 diabetes mellitus without complications: Secondary | ICD-10-CM

## 2015-07-20 HISTORY — DX: Type 2 diabetes mellitus without complications: E11.9

## 2015-08-21 DIAGNOSIS — Z6828 Body mass index (BMI) 28.0-28.9, adult: Secondary | ICD-10-CM | POA: Diagnosis not present

## 2015-08-21 DIAGNOSIS — H8113 Benign paroxysmal vertigo, bilateral: Secondary | ICD-10-CM | POA: Diagnosis not present

## 2015-09-25 DIAGNOSIS — Z1231 Encounter for screening mammogram for malignant neoplasm of breast: Secondary | ICD-10-CM | POA: Diagnosis not present

## 2015-09-29 DIAGNOSIS — H40003 Preglaucoma, unspecified, bilateral: Secondary | ICD-10-CM | POA: Diagnosis not present

## 2015-10-06 DIAGNOSIS — H9042 Sensorineural hearing loss, unilateral, left ear, with unrestricted hearing on the contralateral side: Secondary | ICD-10-CM | POA: Diagnosis not present

## 2015-10-20 DIAGNOSIS — R112 Nausea with vomiting, unspecified: Secondary | ICD-10-CM | POA: Diagnosis not present

## 2015-10-20 DIAGNOSIS — Z6828 Body mass index (BMI) 28.0-28.9, adult: Secondary | ICD-10-CM | POA: Diagnosis not present

## 2015-10-22 DIAGNOSIS — K219 Gastro-esophageal reflux disease without esophagitis: Secondary | ICD-10-CM | POA: Diagnosis not present

## 2015-10-22 DIAGNOSIS — R112 Nausea with vomiting, unspecified: Secondary | ICD-10-CM | POA: Diagnosis not present

## 2015-10-24 DIAGNOSIS — K219 Gastro-esophageal reflux disease without esophagitis: Secondary | ICD-10-CM | POA: Diagnosis not present

## 2015-10-24 DIAGNOSIS — R1310 Dysphagia, unspecified: Secondary | ICD-10-CM | POA: Diagnosis not present

## 2015-10-24 DIAGNOSIS — K449 Diaphragmatic hernia without obstruction or gangrene: Secondary | ICD-10-CM | POA: Diagnosis not present

## 2015-10-24 DIAGNOSIS — K224 Dyskinesia of esophagus: Secondary | ICD-10-CM | POA: Diagnosis not present

## 2015-11-04 DIAGNOSIS — Z86010 Personal history of colonic polyps: Secondary | ICD-10-CM | POA: Diagnosis not present

## 2015-11-04 DIAGNOSIS — K2970 Gastritis, unspecified, without bleeding: Secondary | ICD-10-CM | POA: Diagnosis not present

## 2015-11-04 DIAGNOSIS — E119 Type 2 diabetes mellitus without complications: Secondary | ICD-10-CM | POA: Diagnosis not present

## 2015-11-04 DIAGNOSIS — K449 Diaphragmatic hernia without obstruction or gangrene: Secondary | ICD-10-CM | POA: Diagnosis not present

## 2015-11-04 DIAGNOSIS — Z9049 Acquired absence of other specified parts of digestive tract: Secondary | ICD-10-CM | POA: Diagnosis not present

## 2015-11-04 DIAGNOSIS — R634 Abnormal weight loss: Secondary | ICD-10-CM | POA: Diagnosis not present

## 2015-11-04 DIAGNOSIS — Z79899 Other long term (current) drug therapy: Secondary | ICD-10-CM | POA: Diagnosis not present

## 2015-11-04 DIAGNOSIS — R1310 Dysphagia, unspecified: Secondary | ICD-10-CM | POA: Diagnosis not present

## 2015-11-04 DIAGNOSIS — R110 Nausea: Secondary | ICD-10-CM | POA: Diagnosis not present

## 2015-11-05 DIAGNOSIS — Z789 Other specified health status: Secondary | ICD-10-CM | POA: Diagnosis not present

## 2015-11-05 DIAGNOSIS — E669 Obesity, unspecified: Secondary | ICD-10-CM | POA: Diagnosis not present

## 2015-11-05 DIAGNOSIS — G609 Hereditary and idiopathic neuropathy, unspecified: Secondary | ICD-10-CM | POA: Diagnosis not present

## 2015-11-25 DIAGNOSIS — K449 Diaphragmatic hernia without obstruction or gangrene: Secondary | ICD-10-CM | POA: Diagnosis not present

## 2015-11-25 DIAGNOSIS — K219 Gastro-esophageal reflux disease without esophagitis: Secondary | ICD-10-CM | POA: Diagnosis not present

## 2015-11-25 DIAGNOSIS — R1310 Dysphagia, unspecified: Secondary | ICD-10-CM | POA: Diagnosis not present

## 2015-12-10 DIAGNOSIS — D131 Benign neoplasm of stomach: Secondary | ICD-10-CM | POA: Diagnosis not present

## 2015-12-10 DIAGNOSIS — K219 Gastro-esophageal reflux disease without esophagitis: Secondary | ICD-10-CM | POA: Diagnosis not present

## 2016-05-11 DIAGNOSIS — Z719 Counseling, unspecified: Secondary | ICD-10-CM | POA: Diagnosis not present

## 2016-05-11 DIAGNOSIS — G609 Hereditary and idiopathic neuropathy, unspecified: Secondary | ICD-10-CM | POA: Diagnosis not present

## 2016-05-11 DIAGNOSIS — Z789 Other specified health status: Secondary | ICD-10-CM | POA: Diagnosis not present

## 2016-05-21 DIAGNOSIS — Z23 Encounter for immunization: Secondary | ICD-10-CM | POA: Diagnosis not present

## 2016-06-14 DIAGNOSIS — H43393 Other vitreous opacities, bilateral: Secondary | ICD-10-CM | POA: Diagnosis not present

## 2016-06-14 DIAGNOSIS — H25813 Combined forms of age-related cataract, bilateral: Secondary | ICD-10-CM | POA: Diagnosis not present

## 2016-06-14 DIAGNOSIS — H40013 Open angle with borderline findings, low risk, bilateral: Secondary | ICD-10-CM | POA: Diagnosis not present

## 2016-06-15 DIAGNOSIS — D485 Neoplasm of uncertain behavior of skin: Secondary | ICD-10-CM | POA: Diagnosis not present

## 2016-07-15 DIAGNOSIS — H01021 Squamous blepharitis right upper eyelid: Secondary | ICD-10-CM | POA: Diagnosis not present

## 2016-08-31 DIAGNOSIS — M545 Low back pain: Secondary | ICD-10-CM | POA: Diagnosis not present

## 2016-08-31 DIAGNOSIS — Z9181 History of falling: Secondary | ICD-10-CM | POA: Diagnosis not present

## 2016-08-31 DIAGNOSIS — Z1231 Encounter for screening mammogram for malignant neoplasm of breast: Secondary | ICD-10-CM | POA: Diagnosis not present

## 2016-09-04 DIAGNOSIS — I10 Essential (primary) hypertension: Secondary | ICD-10-CM | POA: Diagnosis not present

## 2016-09-04 DIAGNOSIS — M4696 Unspecified inflammatory spondylopathy, lumbar region: Secondary | ICD-10-CM | POA: Diagnosis not present

## 2016-09-04 DIAGNOSIS — D509 Iron deficiency anemia, unspecified: Secondary | ICD-10-CM | POA: Diagnosis not present

## 2016-09-04 DIAGNOSIS — E1142 Type 2 diabetes mellitus with diabetic polyneuropathy: Secondary | ICD-10-CM | POA: Diagnosis not present

## 2016-09-04 DIAGNOSIS — Z23 Encounter for immunization: Secondary | ICD-10-CM | POA: Diagnosis not present

## 2016-09-04 DIAGNOSIS — E785 Hyperlipidemia, unspecified: Secondary | ICD-10-CM | POA: Diagnosis not present

## 2016-09-04 DIAGNOSIS — M8589 Other specified disorders of bone density and structure, multiple sites: Secondary | ICD-10-CM | POA: Diagnosis not present

## 2016-09-06 DIAGNOSIS — Z6828 Body mass index (BMI) 28.0-28.9, adult: Secondary | ICD-10-CM | POA: Diagnosis not present

## 2016-09-06 DIAGNOSIS — Z136 Encounter for screening for cardiovascular disorders: Secondary | ICD-10-CM | POA: Diagnosis not present

## 2016-09-06 DIAGNOSIS — E669 Obesity, unspecified: Secondary | ICD-10-CM | POA: Diagnosis not present

## 2016-09-06 DIAGNOSIS — Z1231 Encounter for screening mammogram for malignant neoplasm of breast: Secondary | ICD-10-CM | POA: Diagnosis not present

## 2016-09-06 DIAGNOSIS — Z9181 History of falling: Secondary | ICD-10-CM | POA: Diagnosis not present

## 2016-09-06 DIAGNOSIS — Z1389 Encounter for screening for other disorder: Secondary | ICD-10-CM | POA: Diagnosis not present

## 2016-09-06 DIAGNOSIS — Z0000 Encounter for general adult medical examination without abnormal findings: Secondary | ICD-10-CM | POA: Diagnosis not present

## 2016-09-06 DIAGNOSIS — E785 Hyperlipidemia, unspecified: Secondary | ICD-10-CM | POA: Diagnosis not present

## 2016-10-08 DIAGNOSIS — M85852 Other specified disorders of bone density and structure, left thigh: Secondary | ICD-10-CM | POA: Diagnosis not present

## 2016-10-08 DIAGNOSIS — N959 Unspecified menopausal and perimenopausal disorder: Secondary | ICD-10-CM | POA: Diagnosis not present

## 2016-10-08 DIAGNOSIS — Z1231 Encounter for screening mammogram for malignant neoplasm of breast: Secondary | ICD-10-CM | POA: Diagnosis not present

## 2016-12-24 DIAGNOSIS — H40013 Open angle with borderline findings, low risk, bilateral: Secondary | ICD-10-CM | POA: Diagnosis not present

## 2017-03-05 DIAGNOSIS — E785 Hyperlipidemia, unspecified: Secondary | ICD-10-CM | POA: Diagnosis not present

## 2017-03-05 DIAGNOSIS — I10 Essential (primary) hypertension: Secondary | ICD-10-CM | POA: Diagnosis not present

## 2017-03-05 DIAGNOSIS — E1142 Type 2 diabetes mellitus with diabetic polyneuropathy: Secondary | ICD-10-CM | POA: Diagnosis not present

## 2017-03-05 DIAGNOSIS — D509 Iron deficiency anemia, unspecified: Secondary | ICD-10-CM | POA: Diagnosis not present

## 2017-04-29 DIAGNOSIS — Z23 Encounter for immunization: Secondary | ICD-10-CM | POA: Diagnosis not present

## 2017-05-02 DIAGNOSIS — M549 Dorsalgia, unspecified: Secondary | ICD-10-CM | POA: Diagnosis not present

## 2017-05-02 DIAGNOSIS — M546 Pain in thoracic spine: Secondary | ICD-10-CM | POA: Diagnosis not present

## 2017-05-02 DIAGNOSIS — R112 Nausea with vomiting, unspecified: Secondary | ICD-10-CM | POA: Diagnosis not present

## 2017-05-02 DIAGNOSIS — Z6830 Body mass index (BMI) 30.0-30.9, adult: Secondary | ICD-10-CM | POA: Diagnosis not present

## 2017-05-05 DIAGNOSIS — M546 Pain in thoracic spine: Secondary | ICD-10-CM | POA: Diagnosis not present

## 2017-05-12 DIAGNOSIS — S060X9A Concussion with loss of consciousness of unspecified duration, initial encounter: Secondary | ICD-10-CM | POA: Diagnosis not present

## 2017-05-12 DIAGNOSIS — Z6830 Body mass index (BMI) 30.0-30.9, adult: Secondary | ICD-10-CM | POA: Diagnosis not present

## 2017-05-12 DIAGNOSIS — S3982XA Other specified injuries of lower back, initial encounter: Secondary | ICD-10-CM | POA: Diagnosis not present

## 2017-05-12 DIAGNOSIS — M545 Low back pain: Secondary | ICD-10-CM | POA: Diagnosis not present

## 2017-05-12 DIAGNOSIS — M533 Sacrococcygeal disorders, not elsewhere classified: Secondary | ICD-10-CM | POA: Diagnosis not present

## 2017-05-16 DIAGNOSIS — S069X9A Unspecified intracranial injury with loss of consciousness of unspecified duration, initial encounter: Secondary | ICD-10-CM | POA: Diagnosis not present

## 2017-05-16 DIAGNOSIS — S060X9A Concussion with loss of consciousness of unspecified duration, initial encounter: Secondary | ICD-10-CM | POA: Diagnosis not present

## 2017-06-24 DIAGNOSIS — H25813 Combined forms of age-related cataract, bilateral: Secondary | ICD-10-CM | POA: Diagnosis not present

## 2017-06-24 DIAGNOSIS — H40013 Open angle with borderline findings, low risk, bilateral: Secondary | ICD-10-CM | POA: Diagnosis not present

## 2017-06-24 DIAGNOSIS — H04129 Dry eye syndrome of unspecified lacrimal gland: Secondary | ICD-10-CM | POA: Diagnosis not present

## 2017-06-24 DIAGNOSIS — E119 Type 2 diabetes mellitus without complications: Secondary | ICD-10-CM | POA: Diagnosis not present

## 2017-07-19 DIAGNOSIS — G250 Essential tremor: Secondary | ICD-10-CM | POA: Insufficient documentation

## 2017-07-19 HISTORY — DX: Essential tremor: G25.0

## 2017-08-04 DIAGNOSIS — D1801 Hemangioma of skin and subcutaneous tissue: Secondary | ICD-10-CM | POA: Diagnosis not present

## 2017-08-04 DIAGNOSIS — L820 Inflamed seborrheic keratosis: Secondary | ICD-10-CM | POA: Diagnosis not present

## 2017-08-04 DIAGNOSIS — L918 Other hypertrophic disorders of the skin: Secondary | ICD-10-CM | POA: Diagnosis not present

## 2017-08-09 DIAGNOSIS — N763 Subacute and chronic vulvitis: Secondary | ICD-10-CM

## 2017-08-09 DIAGNOSIS — N393 Stress incontinence (female) (male): Secondary | ICD-10-CM

## 2017-08-09 HISTORY — DX: Subacute and chronic vulvitis: N76.3

## 2017-08-09 HISTORY — DX: Stress incontinence (female) (male): N39.3

## 2017-08-18 DIAGNOSIS — H01022 Squamous blepharitis right lower eyelid: Secondary | ICD-10-CM | POA: Diagnosis not present

## 2017-08-22 DIAGNOSIS — N393 Stress incontinence (female) (male): Secondary | ICD-10-CM | POA: Diagnosis not present

## 2017-08-22 DIAGNOSIS — N763 Subacute and chronic vulvitis: Secondary | ICD-10-CM | POA: Diagnosis not present

## 2017-08-26 DIAGNOSIS — M7989 Other specified soft tissue disorders: Secondary | ICD-10-CM | POA: Diagnosis not present

## 2017-08-26 DIAGNOSIS — D492 Neoplasm of unspecified behavior of bone, soft tissue, and skin: Secondary | ICD-10-CM | POA: Diagnosis not present

## 2017-09-06 DIAGNOSIS — Z9181 History of falling: Secondary | ICD-10-CM | POA: Diagnosis not present

## 2017-09-06 DIAGNOSIS — Z136 Encounter for screening for cardiovascular disorders: Secondary | ICD-10-CM | POA: Diagnosis not present

## 2017-09-06 DIAGNOSIS — Z1231 Encounter for screening mammogram for malignant neoplasm of breast: Secondary | ICD-10-CM | POA: Diagnosis not present

## 2017-09-06 DIAGNOSIS — Z0000 Encounter for general adult medical examination without abnormal findings: Secondary | ICD-10-CM | POA: Diagnosis not present

## 2017-09-06 DIAGNOSIS — Z1331 Encounter for screening for depression: Secondary | ICD-10-CM | POA: Diagnosis not present

## 2017-09-06 DIAGNOSIS — Z6831 Body mass index (BMI) 31.0-31.9, adult: Secondary | ICD-10-CM | POA: Diagnosis not present

## 2017-09-06 DIAGNOSIS — E669 Obesity, unspecified: Secondary | ICD-10-CM | POA: Diagnosis not present

## 2017-09-06 DIAGNOSIS — Z1211 Encounter for screening for malignant neoplasm of colon: Secondary | ICD-10-CM | POA: Diagnosis not present

## 2017-09-06 DIAGNOSIS — E785 Hyperlipidemia, unspecified: Secondary | ICD-10-CM | POA: Diagnosis not present

## 2017-09-10 DIAGNOSIS — E1142 Type 2 diabetes mellitus with diabetic polyneuropathy: Secondary | ICD-10-CM | POA: Diagnosis not present

## 2017-09-10 DIAGNOSIS — F329 Major depressive disorder, single episode, unspecified: Secondary | ICD-10-CM | POA: Diagnosis not present

## 2017-09-10 DIAGNOSIS — I10 Essential (primary) hypertension: Secondary | ICD-10-CM | POA: Diagnosis not present

## 2017-09-10 DIAGNOSIS — G250 Essential tremor: Secondary | ICD-10-CM | POA: Diagnosis not present

## 2017-09-10 DIAGNOSIS — D509 Iron deficiency anemia, unspecified: Secondary | ICD-10-CM | POA: Diagnosis not present

## 2017-09-10 DIAGNOSIS — I739 Peripheral vascular disease, unspecified: Secondary | ICD-10-CM | POA: Diagnosis not present

## 2017-09-10 DIAGNOSIS — E785 Hyperlipidemia, unspecified: Secondary | ICD-10-CM | POA: Diagnosis not present

## 2017-09-13 ENCOUNTER — Other Ambulatory Visit: Payer: Self-pay

## 2017-09-13 DIAGNOSIS — I739 Peripheral vascular disease, unspecified: Secondary | ICD-10-CM

## 2017-09-16 DIAGNOSIS — S0990XA Unspecified injury of head, initial encounter: Secondary | ICD-10-CM | POA: Diagnosis not present

## 2017-09-16 DIAGNOSIS — S50312A Abrasion of left elbow, initial encounter: Secondary | ICD-10-CM | POA: Diagnosis not present

## 2017-09-16 DIAGNOSIS — M79641 Pain in right hand: Secondary | ICD-10-CM | POA: Diagnosis not present

## 2017-09-16 DIAGNOSIS — R51 Headache: Secondary | ICD-10-CM | POA: Diagnosis not present

## 2017-09-16 DIAGNOSIS — S060X9A Concussion with loss of consciousness of unspecified duration, initial encounter: Secondary | ICD-10-CM | POA: Diagnosis not present

## 2017-09-29 DIAGNOSIS — E1142 Type 2 diabetes mellitus with diabetic polyneuropathy: Secondary | ICD-10-CM | POA: Diagnosis not present

## 2017-10-08 DIAGNOSIS — I10 Essential (primary) hypertension: Secondary | ICD-10-CM | POA: Diagnosis present

## 2017-10-08 DIAGNOSIS — G250 Essential tremor: Secondary | ICD-10-CM | POA: Diagnosis present

## 2017-10-08 DIAGNOSIS — M7022 Olecranon bursitis, left elbow: Secondary | ICD-10-CM | POA: Diagnosis present

## 2017-10-08 DIAGNOSIS — M71122 Other infective bursitis, left elbow: Secondary | ICD-10-CM | POA: Diagnosis not present

## 2017-10-08 DIAGNOSIS — A419 Sepsis, unspecified organism: Secondary | ICD-10-CM | POA: Diagnosis not present

## 2017-10-08 DIAGNOSIS — S59902A Unspecified injury of left elbow, initial encounter: Secondary | ICD-10-CM | POA: Diagnosis not present

## 2017-10-08 DIAGNOSIS — Z79899 Other long term (current) drug therapy: Secondary | ICD-10-CM | POA: Diagnosis not present

## 2017-10-08 DIAGNOSIS — R5381 Other malaise: Secondary | ICD-10-CM | POA: Diagnosis present

## 2017-10-08 DIAGNOSIS — M1990 Unspecified osteoarthritis, unspecified site: Secondary | ICD-10-CM | POA: Diagnosis present

## 2017-10-08 DIAGNOSIS — R7303 Prediabetes: Secondary | ICD-10-CM | POA: Diagnosis present

## 2017-10-08 DIAGNOSIS — R51 Headache: Secondary | ICD-10-CM | POA: Diagnosis not present

## 2017-10-08 DIAGNOSIS — S0990XA Unspecified injury of head, initial encounter: Secondary | ICD-10-CM | POA: Diagnosis not present

## 2017-10-08 DIAGNOSIS — L03114 Cellulitis of left upper limb: Secondary | ICD-10-CM | POA: Diagnosis present

## 2017-10-08 DIAGNOSIS — S51802A Unspecified open wound of left forearm, initial encounter: Secondary | ICD-10-CM | POA: Diagnosis not present

## 2017-10-08 DIAGNOSIS — E119 Type 2 diabetes mellitus without complications: Secondary | ICD-10-CM | POA: Diagnosis not present

## 2017-10-08 DIAGNOSIS — E7800 Pure hypercholesterolemia, unspecified: Secondary | ICD-10-CM | POA: Diagnosis present

## 2017-10-08 DIAGNOSIS — R55 Syncope and collapse: Secondary | ICD-10-CM | POA: Diagnosis present

## 2017-10-08 DIAGNOSIS — I361 Nonrheumatic tricuspid (valve) insufficiency: Secondary | ICD-10-CM | POA: Diagnosis not present

## 2017-10-08 DIAGNOSIS — S51012A Laceration without foreign body of left elbow, initial encounter: Secondary | ICD-10-CM | POA: Diagnosis present

## 2017-10-08 DIAGNOSIS — K219 Gastro-esophageal reflux disease without esophagitis: Secondary | ICD-10-CM | POA: Diagnosis present

## 2017-10-08 DIAGNOSIS — G629 Polyneuropathy, unspecified: Secondary | ICD-10-CM | POA: Diagnosis present

## 2017-10-08 DIAGNOSIS — E785 Hyperlipidemia, unspecified: Secondary | ICD-10-CM | POA: Diagnosis present

## 2017-10-08 DIAGNOSIS — M7989 Other specified soft tissue disorders: Secondary | ICD-10-CM | POA: Diagnosis not present

## 2017-10-13 DIAGNOSIS — E785 Hyperlipidemia, unspecified: Secondary | ICD-10-CM | POA: Diagnosis not present

## 2017-10-13 DIAGNOSIS — M6281 Muscle weakness (generalized): Secondary | ICD-10-CM | POA: Diagnosis not present

## 2017-10-13 DIAGNOSIS — M71122 Other infective bursitis, left elbow: Secondary | ICD-10-CM | POA: Diagnosis not present

## 2017-10-13 DIAGNOSIS — G250 Essential tremor: Secondary | ICD-10-CM | POA: Diagnosis not present

## 2017-10-13 DIAGNOSIS — I10 Essential (primary) hypertension: Secondary | ICD-10-CM | POA: Diagnosis not present

## 2017-10-13 DIAGNOSIS — L03124 Acute lymphangitis of left upper limb: Secondary | ICD-10-CM | POA: Diagnosis not present

## 2017-10-14 DIAGNOSIS — M6281 Muscle weakness (generalized): Secondary | ICD-10-CM | POA: Diagnosis not present

## 2017-10-14 DIAGNOSIS — L03124 Acute lymphangitis of left upper limb: Secondary | ICD-10-CM | POA: Diagnosis not present

## 2017-10-14 DIAGNOSIS — I10 Essential (primary) hypertension: Secondary | ICD-10-CM | POA: Diagnosis not present

## 2017-10-14 DIAGNOSIS — M71122 Other infective bursitis, left elbow: Secondary | ICD-10-CM | POA: Diagnosis not present

## 2017-10-14 DIAGNOSIS — G250 Essential tremor: Secondary | ICD-10-CM | POA: Diagnosis not present

## 2017-10-14 DIAGNOSIS — E785 Hyperlipidemia, unspecified: Secondary | ICD-10-CM | POA: Diagnosis not present

## 2017-10-15 DIAGNOSIS — G250 Essential tremor: Secondary | ICD-10-CM | POA: Diagnosis not present

## 2017-10-15 DIAGNOSIS — Z6832 Body mass index (BMI) 32.0-32.9, adult: Secondary | ICD-10-CM | POA: Diagnosis not present

## 2017-10-15 DIAGNOSIS — R55 Syncope and collapse: Secondary | ICD-10-CM | POA: Diagnosis not present

## 2017-10-15 DIAGNOSIS — E669 Obesity, unspecified: Secondary | ICD-10-CM | POA: Diagnosis not present

## 2017-10-15 DIAGNOSIS — F329 Major depressive disorder, single episode, unspecified: Secondary | ICD-10-CM | POA: Diagnosis not present

## 2017-10-15 DIAGNOSIS — M71122 Other infective bursitis, left elbow: Secondary | ICD-10-CM | POA: Diagnosis not present

## 2017-10-17 DIAGNOSIS — M71129 Other infective bursitis, unspecified elbow: Secondary | ICD-10-CM

## 2017-10-17 DIAGNOSIS — F329 Major depressive disorder, single episode, unspecified: Secondary | ICD-10-CM | POA: Insufficient documentation

## 2017-10-17 DIAGNOSIS — K219 Gastro-esophageal reflux disease without esophagitis: Secondary | ICD-10-CM

## 2017-10-17 DIAGNOSIS — M6281 Muscle weakness (generalized): Secondary | ICD-10-CM | POA: Diagnosis not present

## 2017-10-17 DIAGNOSIS — E785 Hyperlipidemia, unspecified: Secondary | ICD-10-CM | POA: Diagnosis not present

## 2017-10-17 DIAGNOSIS — G629 Polyneuropathy, unspecified: Secondary | ICD-10-CM

## 2017-10-17 DIAGNOSIS — K635 Polyp of colon: Secondary | ICD-10-CM

## 2017-10-17 DIAGNOSIS — M1990 Unspecified osteoarthritis, unspecified site: Secondary | ICD-10-CM

## 2017-10-17 DIAGNOSIS — M71122 Other infective bursitis, left elbow: Secondary | ICD-10-CM | POA: Diagnosis not present

## 2017-10-17 DIAGNOSIS — I10 Essential (primary) hypertension: Secondary | ICD-10-CM | POA: Insufficient documentation

## 2017-10-17 DIAGNOSIS — I739 Peripheral vascular disease, unspecified: Secondary | ICD-10-CM

## 2017-10-17 DIAGNOSIS — L03124 Acute lymphangitis of left upper limb: Secondary | ICD-10-CM | POA: Diagnosis not present

## 2017-10-17 DIAGNOSIS — K2970 Gastritis, unspecified, without bleeding: Secondary | ICD-10-CM

## 2017-10-17 DIAGNOSIS — G250 Essential tremor: Secondary | ICD-10-CM | POA: Diagnosis not present

## 2017-10-17 DIAGNOSIS — C4490 Unspecified malignant neoplasm of skin, unspecified: Secondary | ICD-10-CM | POA: Insufficient documentation

## 2017-10-17 DIAGNOSIS — F32A Depression, unspecified: Secondary | ICD-10-CM

## 2017-10-17 DIAGNOSIS — K449 Diaphragmatic hernia without obstruction or gangrene: Secondary | ICD-10-CM

## 2017-10-17 DIAGNOSIS — G4733 Obstructive sleep apnea (adult) (pediatric): Secondary | ICD-10-CM | POA: Insufficient documentation

## 2017-10-17 DIAGNOSIS — R55 Syncope and collapse: Secondary | ICD-10-CM

## 2017-10-17 HISTORY — DX: Peripheral vascular disease, unspecified: I73.9

## 2017-10-17 HISTORY — DX: Obstructive sleep apnea (adult) (pediatric): G47.33

## 2017-10-17 HISTORY — DX: Unspecified malignant neoplasm of skin, unspecified: C44.90

## 2017-10-17 HISTORY — DX: Syncope and collapse: R55

## 2017-10-17 HISTORY — DX: Polyneuropathy, unspecified: G62.9

## 2017-10-17 HISTORY — DX: Gastro-esophageal reflux disease without esophagitis: K21.9

## 2017-10-17 HISTORY — DX: Other infective bursitis, unspecified elbow: M71.129

## 2017-10-17 HISTORY — DX: Depression, unspecified: F32.A

## 2017-10-17 HISTORY — DX: Diaphragmatic hernia without obstruction or gangrene: K44.9

## 2017-10-17 HISTORY — DX: Gastritis, unspecified, without bleeding: K29.70

## 2017-10-17 HISTORY — DX: Polyp of colon: K63.5

## 2017-10-17 HISTORY — DX: Unspecified osteoarthritis, unspecified site: M19.90

## 2017-10-18 DIAGNOSIS — M6281 Muscle weakness (generalized): Secondary | ICD-10-CM | POA: Diagnosis not present

## 2017-10-18 DIAGNOSIS — L03124 Acute lymphangitis of left upper limb: Secondary | ICD-10-CM | POA: Diagnosis not present

## 2017-10-18 DIAGNOSIS — M71122 Other infective bursitis, left elbow: Secondary | ICD-10-CM | POA: Diagnosis not present

## 2017-10-18 DIAGNOSIS — G250 Essential tremor: Secondary | ICD-10-CM | POA: Diagnosis not present

## 2017-10-18 DIAGNOSIS — I10 Essential (primary) hypertension: Secondary | ICD-10-CM | POA: Diagnosis not present

## 2017-10-18 DIAGNOSIS — E785 Hyperlipidemia, unspecified: Secondary | ICD-10-CM | POA: Diagnosis not present

## 2017-10-19 DIAGNOSIS — L03124 Acute lymphangitis of left upper limb: Secondary | ICD-10-CM | POA: Diagnosis not present

## 2017-10-19 DIAGNOSIS — G250 Essential tremor: Secondary | ICD-10-CM | POA: Diagnosis not present

## 2017-10-19 DIAGNOSIS — I10 Essential (primary) hypertension: Secondary | ICD-10-CM | POA: Diagnosis not present

## 2017-10-19 DIAGNOSIS — M6281 Muscle weakness (generalized): Secondary | ICD-10-CM | POA: Diagnosis not present

## 2017-10-19 DIAGNOSIS — M71122 Other infective bursitis, left elbow: Secondary | ICD-10-CM | POA: Diagnosis not present

## 2017-10-19 DIAGNOSIS — E785 Hyperlipidemia, unspecified: Secondary | ICD-10-CM | POA: Diagnosis not present

## 2017-10-20 ENCOUNTER — Ambulatory Visit (HOSPITAL_COMMUNITY)
Admission: RE | Admit: 2017-10-20 | Discharge: 2017-10-20 | Disposition: A | Discharge status: Home / Self Care | Payer: Medicare Other | Source: Ambulatory Visit | Attending: Vascular Surgery | Admitting: Vascular Surgery

## 2017-10-20 ENCOUNTER — Encounter: Payer: Self-pay | Admitting: Vascular Surgery

## 2017-10-20 ENCOUNTER — Ambulatory Visit (INDEPENDENT_AMBULATORY_CARE_PROVIDER_SITE_OTHER): Payer: Medicare Other | Admitting: Vascular Surgery

## 2017-10-20 VITALS — BP 102/74 | HR 77 | Temp 97.3°F | Resp 18 | Ht 64.0 in | Wt 184.0 lb

## 2017-10-20 DIAGNOSIS — I739 Peripheral vascular disease, unspecified: Secondary | ICD-10-CM | POA: Diagnosis not present

## 2017-10-20 DIAGNOSIS — R200 Anesthesia of skin: Secondary | ICD-10-CM | POA: Diagnosis not present

## 2017-10-20 DIAGNOSIS — R600 Localized edema: Secondary | ICD-10-CM

## 2017-10-20 HISTORY — DX: Localized edema: R60.0

## 2017-10-20 HISTORY — DX: Anesthesia of skin: R20.0

## 2017-10-20 NOTE — Progress Notes (Addendum)
Requested by:  Nicoletta Dress, MD [place] [place], [place] [address]  Reason for consultation: BLE numbness and pain   History of Present Illness   Rachel Mata is a 72 y.o. ([birth year]) female who presents with chief complaint of numbness in her feet extending to the level of her proximal shins symmetrically.  She will also occasionally have some pain in her feet when walking and at rest.  She denies any severe pain in her feet that wakes her up in the middle the night as well as any nonhealing wounds of her feet.  Past medical history significant for lumbar spinal stenosis for which she takes meloxicam and prediabetes for which she is not on any medication.    She also complains of occasional swelling in bilateral lower extremities especially after being on her feet during the day.  She denies any history of DVT or venous ulcerations.  Her husband wears compression stockings during the day and she is wondering if she should do the same.  She denies tobacco use.  She is on a daily statin.   Past Medical History:  Diagnosis Date  . Anemia   . Colon polyps 10/17/2017  . Depression 10/17/2017  . Diabetes mellitus without complication ([place])   . Gastritis 10/17/2017  . GERD (gastroesophageal reflux disease) 10/17/2017  . Hiatal hernia 10/17/2017  . Hyperlipidemia   . Hypertension   . Osteoarthritis 10/17/2017  . Peripheral neuropathy 10/17/2017  . Peripheral vascular disease ([place])   . PVD (peripheral vascular disease) ([place]) 10/17/2017  . Septic olecranon bursitis 10/17/2017  . Skin cancer 10/17/2017  . Sleep apnea, obstructive 10/17/2017  . Stress incontinence of urine 08/09/2017  . Subacute vulvitis 08/09/2017  . Syncope 10/17/2017    Past Surgical History:  Procedure Laterality Date  . ABDOMINAL HYSTERECTOMY    . BLADDER SURGERY    . CHOLECYSTECTOMY    . PARAESOPHAGEAL HERNIA REPAIR    . RECTOCELE REPAIR      Social History   Socioeconomic History  . Marital status:  Married    Spouse name: Not on file  . Number of children: Not on file  . Years of education: Not on file  . Highest education level: Not on file  Occupational History  . Not on file  Social Needs  . Financial resource strain: Not on file  . Food insecurity:    Worry: Not on file    Inability: Not on file  . Transportation needs:    Medical: Not on file    Non-medical: Not on file  Tobacco Use  . Smoking status: Never Smoker  . Smokeless tobacco: Never Used  Substance and Sexual Activity  . Alcohol use: Yes    Comment: occasional wine  . Drug use: Never  . Sexual activity: Not on file  Lifestyle  . Physical activity:    Days per week: Not on file    Minutes per session: Not on file  . Stress: Not on file  Relationships  . Social connections:    Talks on phone: Not on file    Gets together: Not on file    Attends religious service: Not on file    Active member of club or organization: Not on file    Attends meetings of clubs or organizations: Not on file    Relationship status: Not on file  . Intimate partner violence:    Fear of current or ex partner: Not on file  Emotionally abused: Not on file    Physically abused: Not on file    Forced sexual activity: Not on file  Other Topics Concern  . Not on file  Social History Narrative  . Not on file    Family History  Problem Relation Age of Onset  . Emphysema Mother   . Asthma Mother   . COPD Mother   . Prostate cancer Father   . Heart attack Father   . Heart disease Father   . Hypercholesterolemia Sister   . Heart attack Maternal Grandmother   . Heart attack Maternal Grandfather   . Diabetes Paternal Grandmother   . Diabetes Paternal Grandfather     Current Outpatient Medications  Medication Sig Dispense Refill  . Ascorbic Acid (VITAMIN C) 1000 MG tablet Take 1,000 mg by mouth daily.    . Cholecalciferol (VITAMIN D3) 50000 units TABS Take by mouth.    . dicloxacillin (DYNAPEN) 500 MG capsule TAKE ONE  CAPSULE BY MOUTH 4 TIMES A DAY  0  . escitalopram (LEXAPRO) 20 MG tablet Take 20 mg by mouth daily.    Marland Kitchen esomeprazole (NEXIUM) 40 MG capsule Take 40 mg by mouth daily at 12 noon.    . fesoterodine (TOVIAZ) 4 MG TB24 tablet Take 4 mg by mouth daily.    Marland Kitchen HYDROcodone-acetaminophen (NORCO/VICODIN) 5-325 MG tablet TAKE 1 TABLET BY MOUTH EVERY 4 HOURS  0  . Insulin Infusion Pump (ACCU-CHEK COMBO) KIT by Does not apply route.    Marland Kitchen losartan-hydrochlorothiazide (HYZAAR) 100-25 MG tablet Take 1 tablet by mouth daily.    . meloxicam (MOBIC) 15 MG tablet Take 15 mg by mouth daily.    . promethazine (PHENERGAN) 25 MG tablet Take 25 mg by mouth every 6 (six) hours as needed for nausea or vomiting.    . propranolol (INDERAL) 10 MG tablet Take 10 mg by mouth 3 (three) times daily.  3  . Psyllium (METAMUCIL SMOOTH TEXTURE) 28.3 % POWD Take by mouth 2 (two) times daily.    . simvastatin (ZOCOR) 20 MG tablet Take 20 mg by mouth daily.    . traMADol (ULTRAM) 50 MG tablet Take by mouth every 6 (six) hours as needed.     No current facility-administered medications for this visit.     Allergies  Allergen Reactions  . Penicillin G Other (See Comments)    Pt states it doesn't work    REVIEW OF SYSTEMS (negative unless checked):   Cardiac:  _0  Chest pain or chest pressure? _1  Shortness of breath upon activity? _2  Shortness of breath when lying flat? _3  Irregular heart rhythm?  Vascular:  _4  Pain in calf, thigh, or hip brought on by walking? _5  Pain in feet at night that wakes you up from your sleep? _6  Blood clot in your veins? _7  Leg swelling?  Pulmonary:  _8  Oxygen at home? _9  Productive cough? _10  Wheezing?  Neurologic:  _11  Sudden weakness in arms or legs? _12  Sudden numbness in arms or legs? _13  Sudden onset of difficult speaking or slurred speech? _14  Temporary loss of vision in one eye? _15  Problems with dizziness?  Gastrointestinal:  _16  Blood in stool? _17  Vomited  blood?  Genitourinary:  _18  Burning when urinating? _19  Blood in urine?  Psychiatric:  _20  Major depression  Hematologic:  _21  Bleeding problems? _22  Problems with blood clotting?  Dermatologic:  _23  Rashes or ulcers?  Constitutional:  _24  Fever or chills?  Ear/Nose/Throat:  _25  Change in hearing? _26  Nose bleeds? _27  Sore throat?  Musculoskeletal:  _0  Back pain? _1  Joint pain? _2  Muscle pain?   For VQI Use Only   PRE-ADM LIVING Home  AMB STATUS Ambulatory  CAD Sx None  PRIOR CHF None  STRESS TEST No   Physical Examination     Vitals:   10/20/17 1241  BP: 102/74  Pulse: 77  Resp: 18  Temp: (!) 97.3 F (36.3 C)  TempSrc: Oral  SpO2: 95%  Weight: 184 lb (83.5 kg)  Height: _3  (1.626 m)   Body mass index is 31.58 kg/m.  General Alert, O x 3, WD, NAD  Head [place]/AT,          Neck Supple, mid-line trachea,    Pulmonary Sym exp, good B air movt, CTA B  Cardiac RRR, Nl S1, S2,   Vascular Vessel Right Left  Radial Palpable Palpable  Brachial Palpable Palpable  Carotid Palpable, No Bruit Palpable, No Bruit  Aorta Not palpable N/A  Femoral not examined not examined  Popliteal Not palpable Not palpable  PT Palpable Faintly palpable  DP Not palpable Palpable    Gastro- intestinal soft, non-distended, non-tender to palpation,   Musculo- skeletal M/S 5/5 throughout  , Extremities without ischemic changes  , Non-pitting edema present: R more than LLE, Varicosities present: R anterior shin, No Lipodermatosclerosis present  Neurologic Cranial nerves 2-12 intact ,   Psychiatric Judgement intact, Mood & affect appropriate for pt's clinical situation  Dermatologic See M/S exam for extremity exam, No rashes otherwise noted  Lymphatic  Palpable lymph nodes: None    Non-Invasive Vascular imaging   BLE ABI (10/20/17)  R:   ABI: 0.98   PT: tri  DP: mono  TBI:  0.67  L:   ABI: 0.99,   PT: tri  DP: bi  TBI: 0.6   Outside Studies/Documentation     pages of outside documents were reviewed including: .    Medical Decision Making   Rachel Mata is a 72 y.o. female who presents with: chief complaint of numbness in feet and lower legs symmetrically   Today patient has normal ABIs with palpable pedal pulses bilaterally  Neuropathic symptoms likely related to diagnosis of diabetes mellitus  Recommended continued follow-up with PCP for management of diabetes mellitus  Also recommended elevation and compression (brochure provided) for BLE edema  She will follow-up as needed especially if she develops any nonhealing wounds or claudication   Dagoberto Ligas, PA-C [facility] Office: [phone]  10/20/2017, 1:12 PM  History and exam details as above.  Patient has palpable pulse in her right foot and left foot.  She has essentially normal ABIs.  She does not really have any claudication symptoms.  She does have some symptoms consistent with peripheral neuropathy.  She was told to protect her feet.  She will follow-up with [place] if she develops claudication symptoms or nonhealing wounds over time.  She was given a option today for bilateral lower extremity compression stockings for symptomatically relief of leg swelling.  Rachel Hinds, MD [facility] Office: [phone] Pager: [phone]

## 2017-10-24 DIAGNOSIS — E785 Hyperlipidemia, unspecified: Secondary | ICD-10-CM | POA: Diagnosis not present

## 2017-10-24 DIAGNOSIS — M6281 Muscle weakness (generalized): Secondary | ICD-10-CM | POA: Diagnosis not present

## 2017-10-24 DIAGNOSIS — G250 Essential tremor: Secondary | ICD-10-CM | POA: Diagnosis not present

## 2017-10-24 DIAGNOSIS — M71122 Other infective bursitis, left elbow: Secondary | ICD-10-CM | POA: Diagnosis not present

## 2017-10-24 DIAGNOSIS — I10 Essential (primary) hypertension: Secondary | ICD-10-CM | POA: Diagnosis not present

## 2017-10-24 DIAGNOSIS — L03124 Acute lymphangitis of left upper limb: Secondary | ICD-10-CM | POA: Diagnosis not present

## 2017-10-24 NOTE — Progress Notes (Signed)
Cardiology Office Note:    Date:  10/25/2017   ID:  Rachel Mata, DOB 1945/08/17, MRN [id number]  PCP:  Nicoletta Dress, MD  Cardiologist:  Shirlee More, MD   Referring MD: Nicoletta Dress, MD  ASSESSMENT:    1. Syncope, unspecified syncope type   2. Essential hypertension   3. Tremor    PLAN:    In order of problems listed above:  1. Her episode is uncommon and she has evidence of both hypotension relative and orthostatic shift in blood pressure.  I asked her to use an event monitor for 4 weeks to assess for arrhythmia stop her antihypertensive ace diuretic and monitor sitting and standing blood pressures at home until seen back in the office.  If there are recurrent episodes I would favor an EEG and consider an implantable loop recorder. 2. Discontinue ARB diuretic 3. Continue beta-blocker unless she demonstrates significant bradycardia  Next appointment 6 weeks   Medication Adjustments/Labs and Tests Ordered: Current medicines are reviewed at length with the patient today.  Concerns regarding medicines are outlined above.  Orders Placed This Encounter  Procedures  . CARDIAC EVENT MONITOR  . EKG 12-Lead   No orders of the defined types were placed in this encounter.    Chief Complaint  Patient presents with  . Loss of Consciousness  . Hypertension  . Diabetes Mellitus    with insulin pump    History of Present Illness:    Rachel Mata is a 72 y.o. female with hypertension, T2 DM with an insulin pump and hyperlipidemia who is being seen today for the evaluation of syncope at the request of Nicoletta Dress, MD. She was admitted to [facility] 10/08/17 with cellulitis and infected bursa of the  elbow aftera fall without recall, possible syncope. Echo EF 55-60%, trace AR, WBC elevated 13,900 BMP was normal and no arrhythmia noted. She also had a similar episode in October 2018. Both episodes occurred after shifting posture and going to the bathroom on the way out.  She  has loss of consciousness but after the episode occurred she is confused for a period of time she did not have any witnessed seizure activity.  She has a history of prediabetes but does not have hypoglycemia.  She takes a beta-blocker for treatment and antihypertensive medication tells me her home blood pressures run in the range of 834 systolic.  In my office today she has an orthostatic blood pressure 100 standing.  She is at risk for arrhythmia utilize an event monitor for the next month I stopped her ACE inhibitor asked her to check blood pressure sitting standing daily and will reassess back in the office in 6 weeks if she has additional episodes I do advise an EEG and consideration of implantable loop recorder.  She does get orthostatic lightheadedness.  She has no history of congenital rheumatic heart disease.  Past Medical History:  Diagnosis Date  . Anemia   . Colon polyps 10/17/2017  . Depression 10/17/2017  . Diabetes mellitus without complication ([place])   . Gastritis 10/17/2017  . GERD (gastroesophageal reflux disease) 10/17/2017  . Hiatal hernia 10/17/2017  . Hyperlipidemia   . Hypertension   . Osteoarthritis 10/17/2017  . Peripheral neuropathy 10/17/2017  . Peripheral vascular disease ([place])   . PVD (peripheral vascular disease) ([place]) 10/17/2017  . Septic olecranon bursitis 10/17/2017  . Skin cancer 10/17/2017  . Sleep apnea, obstructive 10/17/2017  . Stress incontinence of urine 08/09/2017  . Subacute vulvitis 08/09/2017  .  Syncope 10/17/2017    Past Surgical History:  Procedure Laterality Date  . ABDOMINAL HYSTERECTOMY    . BLADDER SURGERY    . CHOLECYSTECTOMY    . PARAESOPHAGEAL HERNIA REPAIR    . RECTOCELE REPAIR      Current Medications: Current Meds  Medication Sig  . Cholecalciferol (VITAMIN D3) 50000 units TABS Take 1 tablet by mouth once a week.   . escitalopram (LEXAPRO) 20 MG tablet Take 20 mg by mouth daily.  Marland Kitchen esomeprazole (NEXIUM) 40 MG capsule Take 40 mg by mouth daily at 12  noon.  . fesoterodine (TOVIAZ) 4 MG TB24 tablet Take 4 mg by mouth daily.  . meloxicam (MOBIC) 15 MG tablet Take 15 mg by mouth daily.  . promethazine (PHENERGAN) 25 MG tablet Take 25 mg by mouth every 6 (six) hours as needed for nausea or vomiting.  . propranolol (INDERAL) 10 MG tablet Take 10 mg by mouth 3 (three) times daily.  . Psyllium (METAMUCIL SMOOTH TEXTURE) 28.3 % POWD Take by mouth 2 (two) times daily as needed.   . simvastatin (ZOCOR) 20 MG tablet Take 20 mg by mouth daily.  . traMADol (ULTRAM) 50 MG tablet Take by mouth every 6 (six) hours as needed.  . [DISCONTINUED] losartan-hydrochlorothiazide (HYZAAR) 100-25 MG tablet Take 1 tablet by mouth daily.     Allergies:   Penicillin g   Social History   Socioeconomic History  . Marital status: Married    Spouse name: Not on file  . Number of children: Not on file  . Years of education: Not on file  . Highest education level: Not on file  Occupational History  . Not on file  Social Needs  . Financial resource strain: Not on file  . Food insecurity:    Worry: Not on file    Inability: Not on file  . Transportation needs:    Medical: Not on file    Non-medical: Not on file  Tobacco Use  . Smoking status: Never Smoker  . Smokeless tobacco: Never Used  Substance and Sexual Activity  . Alcohol use: Yes    Comment: occasional wine  . Drug use: Never  . Sexual activity: Not on file  Lifestyle  . Physical activity:    Days per week: Not on file    Minutes per session: Not on file  . Stress: Not on file  Relationships  . Social connections:    Talks on phone: Not on file    Gets together: Not on file    Attends religious service: Not on file    Active member of club or organization: Not on file    Attends meetings of clubs or organizations: Not on file    Relationship status: Not on file  Other Topics Concern  . Not on file  Social History Narrative  . Not on file     Family History: The patient's family  history includes Asthma in her mother; COPD in her mother; Diabetes in her paternal grandfather and paternal grandmother; Emphysema in her mother; Heart attack in her father, maternal grandfather, and maternal grandmother; Heart disease in her father; Hypercholesterolemia in her sister; Prostate cancer in her father.  ROS:   Review of Systems  Constitution: Negative.  HENT: Negative.   Eyes: Negative.   Cardiovascular: Positive for syncope.  Respiratory: Positive for shortness of breath.   Endocrine: Negative.   Hematologic/Lymphatic: Negative.   Skin: Negative.   Musculoskeletal: Negative.   Gastrointestinal: Negative.   Genitourinary: Negative.  Neurological: Positive for dizziness.  Psychiatric/Behavioral: Negative.   Allergic/Immunologic: Negative.    Please see the history of present illness.     All other systems reviewed and are negative.  EKGs/Labs/Other Studies Reviewed:    The following studies were reviewed today: I reviewed the [facility] records before the visit  EKG:  EKG is  ordered today.  The ekg ordered today demonstrates sinus rhythm normal  Recent Labs: No results found for requested labs within last 8760 hours.  Recent Lipid Panel No results found for: CHOL, TRIG, HDL, CHOLHDL, VLDL, LDLCALC, LDLDIRECT  Physical Exam:    VS:  BP 104/78 (BP Location: Left Arm, Patient Position: Sitting, Cuff Size: Normal)   Pulse 71   Ht 5\' 3"  (1.6 m)   Wt 180 lb 12.8 oz (82 kg)   SpO2 97%   BMI 32.03 kg/m     Wt Readings from Last 3 Encounters:  10/25/17 180 lb 12.8 oz (82 kg)  10/20/17 184 lb (83.5 kg)    Blood pressure by me 112/70 sitting 100/60 standing. GEN:  Well nourished, well developed in no acute distress HEENT: Normal NECK: No JVD; No carotid bruits LYMPHATICS: No lymphadenopathy CARDIAC: RRR, no murmurs, rubs, gallops RESPIRATORY:  Clear to auscultation without rales, wheezing or rhonchi  ABDOMEN: Soft, non-tender,  non-distended MUSCULOSKELETAL:  No edema; No deformity  SKIN: Warm and dry NEUROLOGIC:  Alert and oriented x 3 PSYCHIATRIC:  Normal affect     Signed, Shirlee More, MD  10/25/2017 10:52 AM    [place]

## 2017-10-25 ENCOUNTER — Ambulatory Visit (INDEPENDENT_AMBULATORY_CARE_PROVIDER_SITE_OTHER): Payer: Medicare Other | Admitting: Cardiology

## 2017-10-25 VITALS — BP 104/78 | HR 71 | Ht 63.0 in | Wt 180.8 lb

## 2017-10-25 DIAGNOSIS — I10 Essential (primary) hypertension: Secondary | ICD-10-CM | POA: Diagnosis not present

## 2017-10-25 DIAGNOSIS — Z9641 Presence of insulin pump (external) (internal): Secondary | ICD-10-CM | POA: Insufficient documentation

## 2017-10-25 DIAGNOSIS — R55 Syncope and collapse: Secondary | ICD-10-CM

## 2017-10-25 DIAGNOSIS — E1142 Type 2 diabetes mellitus with diabetic polyneuropathy: Secondary | ICD-10-CM

## 2017-10-25 DIAGNOSIS — R251 Tremor, unspecified: Secondary | ICD-10-CM

## 2017-10-25 HISTORY — DX: Presence of insulin pump (external) (internal): Z96.41

## 2017-10-25 HISTORY — DX: Type 2 diabetes mellitus with diabetic polyneuropathy: E11.42

## 2017-10-25 NOTE — Patient Instructions (Addendum)
Medication Instructions:  Your physician has recommended you make the following change in your medication:  STOP losartan-hydrochlorothiazide  Check your blood pressure sitting and standing daily and record. Bring to next office visit.  Labwork: None  Testing/Procedures: You had an EKG today.  Your physician has recommended that you wear an event monitor. Event monitors are medical devices that record the heart's electrical activity. Doctors most often [place] these monitors to diagnose arrhythmias. Arrhythmias are problems with the speed or rhythm of the heartbeat. The monitor is a small, portable device. You can wear one while you do your normal daily activities. This is usually used to diagnose what is causing palpitations/syncope (passing out). 30 days.  Follow-Up: Your physician recommends that you schedule a follow-up appointment in: 6 weeks.  Any Other Special Instructions Will Be Listed Below (If Applicable).     If you need a refill on your cardiac medications before your next appointment, please call your pharmacy.    If you have another episode check your glucose immediately

## 2017-10-26 ENCOUNTER — Telehealth: Payer: Self-pay | Admitting: Cardiology

## 2017-10-26 NOTE — Telephone Encounter (Signed)
Advised patient that no one called her from the office. Asked if they left a voicemail. Patient states no one did, just seen she missed a call. Advised patient to disregard at this time. Patient verbalized understanding, no further questions.

## 2017-10-26 NOTE — Telephone Encounter (Signed)
States she's returning someones call but doesn't know who's

## 2017-10-27 DIAGNOSIS — M71122 Other infective bursitis, left elbow: Secondary | ICD-10-CM | POA: Diagnosis not present

## 2017-10-27 DIAGNOSIS — E785 Hyperlipidemia, unspecified: Secondary | ICD-10-CM | POA: Diagnosis not present

## 2017-10-27 DIAGNOSIS — L03124 Acute lymphangitis of left upper limb: Secondary | ICD-10-CM | POA: Diagnosis not present

## 2017-10-27 DIAGNOSIS — I10 Essential (primary) hypertension: Secondary | ICD-10-CM | POA: Diagnosis not present

## 2017-10-27 DIAGNOSIS — M6281 Muscle weakness (generalized): Secondary | ICD-10-CM | POA: Diagnosis not present

## 2017-10-27 DIAGNOSIS — G250 Essential tremor: Secondary | ICD-10-CM | POA: Diagnosis not present

## 2017-10-31 ENCOUNTER — Ambulatory Visit: Payer: Medicare Other

## 2017-10-31 DIAGNOSIS — I10 Essential (primary) hypertension: Secondary | ICD-10-CM | POA: Diagnosis not present

## 2017-10-31 DIAGNOSIS — E785 Hyperlipidemia, unspecified: Secondary | ICD-10-CM | POA: Diagnosis not present

## 2017-10-31 DIAGNOSIS — L03124 Acute lymphangitis of left upper limb: Secondary | ICD-10-CM | POA: Diagnosis not present

## 2017-10-31 DIAGNOSIS — R55 Syncope and collapse: Secondary | ICD-10-CM | POA: Diagnosis not present

## 2017-10-31 DIAGNOSIS — G250 Essential tremor: Secondary | ICD-10-CM | POA: Diagnosis not present

## 2017-10-31 DIAGNOSIS — M6281 Muscle weakness (generalized): Secondary | ICD-10-CM | POA: Diagnosis not present

## 2017-10-31 DIAGNOSIS — M71122 Other infective bursitis, left elbow: Secondary | ICD-10-CM | POA: Diagnosis not present

## 2017-11-01 DIAGNOSIS — F329 Major depressive disorder, single episode, unspecified: Secondary | ICD-10-CM | POA: Diagnosis not present

## 2017-11-01 DIAGNOSIS — Z6830 Body mass index (BMI) 30.0-30.9, adult: Secondary | ICD-10-CM | POA: Diagnosis not present

## 2017-11-01 DIAGNOSIS — R197 Diarrhea, unspecified: Secondary | ICD-10-CM | POA: Diagnosis not present

## 2017-11-03 DIAGNOSIS — M6281 Muscle weakness (generalized): Secondary | ICD-10-CM | POA: Diagnosis not present

## 2017-11-03 DIAGNOSIS — G250 Essential tremor: Secondary | ICD-10-CM | POA: Diagnosis not present

## 2017-11-03 DIAGNOSIS — L03124 Acute lymphangitis of left upper limb: Secondary | ICD-10-CM | POA: Diagnosis not present

## 2017-11-03 DIAGNOSIS — E785 Hyperlipidemia, unspecified: Secondary | ICD-10-CM | POA: Diagnosis not present

## 2017-11-03 DIAGNOSIS — I10 Essential (primary) hypertension: Secondary | ICD-10-CM | POA: Diagnosis not present

## 2017-11-03 DIAGNOSIS — M71122 Other infective bursitis, left elbow: Secondary | ICD-10-CM | POA: Diagnosis not present

## 2017-11-07 DIAGNOSIS — M6281 Muscle weakness (generalized): Secondary | ICD-10-CM | POA: Diagnosis not present

## 2017-11-07 DIAGNOSIS — I10 Essential (primary) hypertension: Secondary | ICD-10-CM | POA: Diagnosis not present

## 2017-11-07 DIAGNOSIS — L03124 Acute lymphangitis of left upper limb: Secondary | ICD-10-CM | POA: Diagnosis not present

## 2017-11-07 DIAGNOSIS — E785 Hyperlipidemia, unspecified: Secondary | ICD-10-CM | POA: Diagnosis not present

## 2017-11-07 DIAGNOSIS — G250 Essential tremor: Secondary | ICD-10-CM | POA: Diagnosis not present

## 2017-11-07 DIAGNOSIS — M71122 Other infective bursitis, left elbow: Secondary | ICD-10-CM | POA: Diagnosis not present

## 2017-11-10 DIAGNOSIS — I10 Essential (primary) hypertension: Secondary | ICD-10-CM | POA: Diagnosis not present

## 2017-11-10 DIAGNOSIS — L03124 Acute lymphangitis of left upper limb: Secondary | ICD-10-CM | POA: Diagnosis not present

## 2017-11-10 DIAGNOSIS — M6281 Muscle weakness (generalized): Secondary | ICD-10-CM | POA: Diagnosis not present

## 2017-11-10 DIAGNOSIS — M71122 Other infective bursitis, left elbow: Secondary | ICD-10-CM | POA: Diagnosis not present

## 2017-11-10 DIAGNOSIS — G250 Essential tremor: Secondary | ICD-10-CM | POA: Diagnosis not present

## 2017-11-10 DIAGNOSIS — E785 Hyperlipidemia, unspecified: Secondary | ICD-10-CM | POA: Diagnosis not present

## 2017-12-05 DIAGNOSIS — I10 Essential (primary) hypertension: Secondary | ICD-10-CM | POA: Diagnosis not present

## 2017-12-05 DIAGNOSIS — M25561 Pain in right knee: Secondary | ICD-10-CM | POA: Diagnosis not present

## 2017-12-13 DIAGNOSIS — Z1231 Encounter for screening mammogram for malignant neoplasm of breast: Secondary | ICD-10-CM | POA: Diagnosis not present

## 2018-01-02 ENCOUNTER — Ambulatory Visit: Payer: Medicare Other | Admitting: Cardiology

## 2018-01-03 ENCOUNTER — Ambulatory Visit: Payer: Medicare Other | Admitting: Cardiology

## 2018-01-11 DIAGNOSIS — H40013 Open angle with borderline findings, low risk, bilateral: Secondary | ICD-10-CM | POA: Diagnosis not present

## 2018-02-24 DIAGNOSIS — Z6831 Body mass index (BMI) 31.0-31.9, adult: Secondary | ICD-10-CM | POA: Diagnosis not present

## 2018-02-24 DIAGNOSIS — I10 Essential (primary) hypertension: Secondary | ICD-10-CM | POA: Diagnosis not present

## 2018-03-24 DIAGNOSIS — Z6831 Body mass index (BMI) 31.0-31.9, adult: Secondary | ICD-10-CM | POA: Diagnosis not present

## 2018-03-24 DIAGNOSIS — G250 Essential tremor: Secondary | ICD-10-CM | POA: Diagnosis not present

## 2018-03-24 DIAGNOSIS — I10 Essential (primary) hypertension: Secondary | ICD-10-CM | POA: Diagnosis not present

## 2018-03-24 DIAGNOSIS — F419 Anxiety disorder, unspecified: Secondary | ICD-10-CM | POA: Diagnosis not present

## 2018-04-08 DIAGNOSIS — I10 Essential (primary) hypertension: Secondary | ICD-10-CM | POA: Diagnosis not present

## 2018-04-08 DIAGNOSIS — Z791 Long term (current) use of non-steroidal anti-inflammatories (NSAID): Secondary | ICD-10-CM | POA: Diagnosis not present

## 2018-04-08 DIAGNOSIS — S93402A Sprain of unspecified ligament of left ankle, initial encounter: Secondary | ICD-10-CM | POA: Diagnosis not present

## 2018-04-08 DIAGNOSIS — S6991XA Unspecified injury of right wrist, hand and finger(s), initial encounter: Secondary | ICD-10-CM | POA: Diagnosis not present

## 2018-04-08 DIAGNOSIS — Z23 Encounter for immunization: Secondary | ICD-10-CM | POA: Diagnosis not present

## 2018-04-08 DIAGNOSIS — Z880 Allergy status to penicillin: Secondary | ICD-10-CM | POA: Diagnosis not present

## 2018-04-08 DIAGNOSIS — G629 Polyneuropathy, unspecified: Secondary | ICD-10-CM | POA: Diagnosis not present

## 2018-04-08 DIAGNOSIS — Z79899 Other long term (current) drug therapy: Secondary | ICD-10-CM | POA: Diagnosis not present

## 2018-04-08 DIAGNOSIS — S92252A Displaced fracture of navicular [scaphoid] of left foot, initial encounter for closed fracture: Secondary | ICD-10-CM | POA: Diagnosis not present

## 2018-04-08 DIAGNOSIS — G319 Degenerative disease of nervous system, unspecified: Secondary | ICD-10-CM | POA: Diagnosis not present

## 2018-04-08 DIAGNOSIS — S92251A Displaced fracture of navicular [scaphoid] of right foot, initial encounter for closed fracture: Secondary | ICD-10-CM | POA: Diagnosis not present

## 2018-04-08 DIAGNOSIS — G8911 Acute pain due to trauma: Secondary | ICD-10-CM | POA: Diagnosis not present

## 2018-04-08 DIAGNOSIS — S0990XA Unspecified injury of head, initial encounter: Secondary | ICD-10-CM | POA: Diagnosis not present

## 2018-04-08 DIAGNOSIS — S01511A Laceration without foreign body of lip, initial encounter: Secondary | ICD-10-CM | POA: Diagnosis not present

## 2018-04-12 DIAGNOSIS — M79672 Pain in left foot: Secondary | ICD-10-CM | POA: Diagnosis not present

## 2018-04-15 DIAGNOSIS — S01511D Laceration without foreign body of lip, subsequent encounter: Secondary | ICD-10-CM | POA: Diagnosis not present

## 2018-04-15 DIAGNOSIS — Z4802 Encounter for removal of sutures: Secondary | ICD-10-CM | POA: Diagnosis not present

## 2018-04-22 DIAGNOSIS — D509 Iron deficiency anemia, unspecified: Secondary | ICD-10-CM | POA: Diagnosis not present

## 2018-04-22 DIAGNOSIS — E785 Hyperlipidemia, unspecified: Secondary | ICD-10-CM | POA: Diagnosis not present

## 2018-04-22 DIAGNOSIS — E1142 Type 2 diabetes mellitus with diabetic polyneuropathy: Secondary | ICD-10-CM | POA: Diagnosis not present

## 2018-05-01 DIAGNOSIS — Z23 Encounter for immunization: Secondary | ICD-10-CM | POA: Diagnosis not present

## 2018-07-17 DIAGNOSIS — H40013 Open angle with borderline findings, low risk, bilateral: Secondary | ICD-10-CM | POA: Diagnosis not present

## 2018-07-17 DIAGNOSIS — E119 Type 2 diabetes mellitus without complications: Secondary | ICD-10-CM | POA: Diagnosis not present

## 2018-07-17 DIAGNOSIS — H25813 Combined forms of age-related cataract, bilateral: Secondary | ICD-10-CM | POA: Diagnosis not present

## 2018-08-21 DIAGNOSIS — Z1211 Encounter for screening for malignant neoplasm of colon: Secondary | ICD-10-CM | POA: Diagnosis not present

## 2018-08-21 DIAGNOSIS — R0609 Other forms of dyspnea: Secondary | ICD-10-CM | POA: Diagnosis not present

## 2018-08-21 DIAGNOSIS — R600 Localized edema: Secondary | ICD-10-CM | POA: Diagnosis not present

## 2018-08-22 ENCOUNTER — Telehealth: Payer: Self-pay | Admitting: Gastroenterology

## 2018-09-04 ENCOUNTER — Ambulatory Visit (INDEPENDENT_AMBULATORY_CARE_PROVIDER_SITE_OTHER): Payer: Medicare Other | Admitting: Cardiology

## 2018-09-04 ENCOUNTER — Encounter: Payer: Self-pay | Admitting: Cardiology

## 2018-09-04 ENCOUNTER — Telehealth: Payer: Self-pay | Admitting: *Deleted

## 2018-09-04 VITALS — BP 118/62 | HR 84 | Ht 63.0 in | Wt 182.0 lb

## 2018-09-04 DIAGNOSIS — G4733 Obstructive sleep apnea (adult) (pediatric): Secondary | ICD-10-CM | POA: Diagnosis not present

## 2018-09-04 DIAGNOSIS — A084 Viral intestinal infection, unspecified: Secondary | ICD-10-CM | POA: Diagnosis not present

## 2018-09-04 DIAGNOSIS — E860 Dehydration: Secondary | ICD-10-CM | POA: Diagnosis not present

## 2018-09-04 DIAGNOSIS — E876 Hypokalemia: Secondary | ICD-10-CM

## 2018-09-04 DIAGNOSIS — I10 Essential (primary) hypertension: Secondary | ICD-10-CM

## 2018-09-04 DIAGNOSIS — M7989 Other specified soft tissue disorders: Secondary | ICD-10-CM | POA: Diagnosis not present

## 2018-09-04 DIAGNOSIS — E1142 Type 2 diabetes mellitus with diabetic polyneuropathy: Secondary | ICD-10-CM | POA: Diagnosis not present

## 2018-09-04 HISTORY — DX: Other specified soft tissue disorders: M79.89

## 2018-09-04 LAB — BASIC METABOLIC PANEL
BUN/Creatinine Ratio: 23 (ref 12–28)
BUN: 29 mg/dL — ABNORMAL HIGH (ref 8–27)
CHLORIDE: 85 mmol/L — AB (ref 96–106)
CO2: 28 mmol/L (ref 20–29)
Calcium: 10.8 mg/dL — ABNORMAL HIGH (ref 8.7–10.3)
Creatinine, Ser: 1.28 mg/dL — ABNORMAL HIGH (ref 0.57–1.00)
GFR calc non Af Amer: 42 mL/min/{1.73_m2} — ABNORMAL LOW (ref >59)
GFR, EST AFRICAN AMERICAN: 48 mL/min/{1.73_m2} — AB (ref >59)
Glucose: 139 mg/dL — ABNORMAL HIGH (ref 65–99)
Potassium: 2.9 mmol/L — ABNORMAL LOW (ref 3.5–5.2)
Sodium: 134 mmol/L (ref 134–144)

## 2018-09-04 MED ORDER — POTASSIUM CHLORIDE ER 10 MEQ PO TBCR: 10.0000 meq | EXTENDED_RELEASE_TABLET | Freq: Every day | ORAL | 0 refills | Status: DC

## 2018-09-04 NOTE — Addendum Note (Signed)
Addended by: Orland Penman on: 09/04/2018 09:48 AM   Modules accepted: Orders

## 2018-09-04 NOTE — Patient Instructions (Signed)
Medication Instructions:   Your physician recommends that you continue on your current medications as directed. Please refer to the Current Medication list given to you today.  If you need a refill on your cardiac medications before your next appointment, please call your pharmacy.   Lab work:  Your physician recommends that you return for lab work today: BMP  If you have labs (blood work) drawn today and your tests are completely normal, you will receive your results only by: Marland Kitchen MyChart Message (if you have MyChart) OR . A paper copy in the mail If you have any lab test that is abnormal or we need to change your treatment, we will call you to review the results.  Testing/Procedures:  Your physician has requested that you have a lower or upper extremity venous duplex. This test is an ultrasound of the veins in the legs or arms. It looks at venous blood flow that carries blood from the heart to the legs or arms. Allow one hour for a Lower Venous exam. Allow thirty minutes for an Upper Venous exam. There are no restrictions or special instructions.     Follow-Up: At [facility], you and your health needs are our priority.  As part of our continuing mission to provide you with exceptional heart care, we have created designated Provider Care Teams.  These Care Teams include your primary Cardiologist (physician) and Advanced Practice Providers (APPs -  Physician Assistants and Nurse Practitioners) who all work together to provide you with the care you need, when you need it.   You will need a follow up appointment in 6 months.  Please call our office 2 months in advance to schedule this appointment.    Any Other Special Instructions Will Be Listed Below (If Applicable).  You have been referred to see a provider for a consultation to have possible sleep study. You should receive a phone call from Dr. Juanetta Gosling Office to get this scheduled.

## 2018-09-04 NOTE — Telephone Encounter (Signed)
Patient informed of lab results and advised to start potassium 10 mEq daily. Patient verbalized understanding and prescription sent to pharmacy as requested. Patient will return to our office in 1 week for follow up lab work, no appointment needed. Patient verbalized understanding. No further questions.

## 2018-09-04 NOTE — Progress Notes (Signed)
Cardiology Office Note:    Date:  09/04/2018   ID:  Rachel Mata, DOB [birth year], MRN [id number]  PCP:  Nicoletta Dress, MD  Cardiologist:  Jenean Lindau, MD   Referring MD: Nicoletta Dress, MD    ASSESSMENT:    1. Essential hypertension   2. Sleep apnea, obstructive   3. Leg swelling    PLAN:    In order of problems listed above:  1. Primary prevention stressed with the patient.  Importance of compliance with diet and medication stressed and she vocalized understanding.  Her blood pressure is stable. 2. I told her anytime she has fluid loss from the body like diarrhea she should not take her diuretic medications and she vocalized understanding.  Very likely had a passing out spell was a combination of diarrhea, pressure medication and diuretics.  Things are stable at this time and in view of the above we will do a Chem-7 today. 3. Her right leg has more swelling than the left and it gets worse at night so we will do a DVT study. 4. Patient will be seen in follow-up appointment in 6 months or earlier if the patient has any concerns    Medication Adjustments/Labs and Tests Ordered: Current medicines are reviewed at length with the patient today.  Concerns regarding medicines are outlined above.  No orders of the defined types were placed in this encounter.  No orders of the defined types were placed in this encounter.    No chief complaint on file.    History of Present Illness:    Rachel Mata is a 73 y.o. female.  Patient has had syncopal spells in the past has had a complete evaluation by my partner.  Her evaluation has come out negative.  Currently for the past several months she has had no syncopal episodes or any such events.  She had diarrhea last week and continue taking her blood pressure medication and diuretic and almost passed out.  Subsequently she is done fine.  No chest pain orthopnea or PND.  She complains of leg swelling on and off in her right leg  and also about snoring and history of sleep apnea which she has ignored over the past several years.  Past Medical History:  Diagnosis Date  . Anemia   . Colon polyps 10/17/2017  . Depression 10/17/2017  . Diabetes mellitus without complication ([place])   . Gastritis 10/17/2017  . GERD (gastroesophageal reflux disease) 10/17/2017  . Hiatal hernia 10/17/2017  . Hyperlipidemia   . Hypertension   . Osteoarthritis 10/17/2017  . Peripheral neuropathy 10/17/2017  . Peripheral vascular disease ([place])   . PVD (peripheral vascular disease) ([place]) 10/17/2017  . Septic olecranon bursitis 10/17/2017  . Skin cancer 10/17/2017  . Sleep apnea, obstructive 10/17/2017  . Stress incontinence of urine 08/09/2017  . Subacute vulvitis 08/09/2017  . Syncope 10/17/2017    Past Surgical History:  Procedure Laterality Date  . ABDOMINAL HYSTERECTOMY    . BLADDER SURGERY    . CHOLECYSTECTOMY    . PARAESOPHAGEAL HERNIA REPAIR    . RECTOCELE REPAIR      Current Medications: Current Meds  Medication Sig  . escitalopram (LEXAPRO) 20 MG tablet Take 20 mg by mouth daily.  Marland Kitchen esomeprazole (NEXIUM) 40 MG capsule Take 40 mg by mouth daily at 12 noon.  . fesoterodine (TOVIAZ) 4 MG TB24 tablet Take 4 mg by mouth daily.  . promethazine (PHENERGAN) 25 MG tablet Take 25 mg by mouth every 6 (  six) hours as needed for nausea or vomiting.  . propranolol (INDERAL) 10 MG tablet Take 10 mg by mouth 3 (three) times daily.  . Psyllium (METAMUCIL SMOOTH TEXTURE) 28.3 % POWD Take by mouth 2 (two) times daily as needed.   . simvastatin (ZOCOR) 20 MG tablet Take 20 mg by mouth daily.  . traMADol (ULTRAM) 50 MG tablet Take by mouth every 6 (six) hours as needed.     Allergies:   Penicillin g   Social History   Socioeconomic History  . Marital status: Married    Spouse name: Not on file  . Number of children: Not on file  . Years of education: Not on file  . Highest education level: Not on file  Occupational History  . Not on file  Social Needs   . Financial resource strain: Not on file  . Food insecurity:    Worry: Not on file    Inability: Not on file  . Transportation needs:    Medical: Not on file    Non-medical: Not on file  Tobacco Use  . Smoking status: Never Smoker  . Smokeless tobacco: Never Used  Substance and Sexual Activity  . Alcohol use: Yes    Comment: occasional wine  . Drug use: Never  . Sexual activity: Not on file  Lifestyle  . Physical activity:    Days per week: Not on file    Minutes per session: Not on file  . Stress: Not on file  Relationships  . Social connections:    Talks on phone: Not on file    Gets together: Not on file    Attends religious service: Not on file    Active member of club or organization: Not on file    Attends meetings of clubs or organizations: Not on file    Relationship status: Not on file  Other Topics Concern  . Not on file  Social History Narrative  . Not on file     Family History: The patient's family history includes Asthma in her mother; COPD in her mother; Diabetes in her paternal grandfather and paternal grandmother; Emphysema in her mother; Heart attack in her father, maternal grandfather, and maternal grandmother; Heart disease in her father; Hypercholesterolemia in her sister; Prostate cancer in her father.  ROS:   Please see the history of present illness.    All other systems reviewed and are negative.  EKGs/Labs/Other Studies Reviewed:    The following studies were reviewed today: I discussed my findings with the patient at extensive length   Recent Labs: No results found for requested labs within last 8760 hours.  Recent Lipid Panel No results found for: CHOL, TRIG, HDL, CHOLHDL, VLDL, LDLCALC, LDLDIRECT  Physical Exam:    VS:  BP 118/62 (BP Location: Right Arm, Patient Position: Sitting, Cuff Size: Normal)   Pulse 84   Ht 5\' 3"  (1.6 m)   Wt 182 lb (82.6 kg)   SpO2 97%   BMI 32.24 kg/m     Wt Readings from Last 3 Encounters:    09/04/18 182 lb (82.6 kg)  10/25/17 180 lb 12.8 oz (82 kg)  10/20/17 184 lb (83.5 kg)     GEN: Patient is in no acute distress HEENT: Normal NECK: No JVD; No carotid bruits LYMPHATICS: No lymphadenopathy CARDIAC: Hear sounds regular, 2/6 systolic murmur at the apex. RESPIRATORY:  Clear to auscultation without rales, wheezing or rhonchi  ABDOMEN: Soft, non-tender, non-distended MUSCULOSKELETAL:  No edema; No deformity.  Right leg  is swollen mildly more than the left SKIN: Warm and dry NEUROLOGIC:  Alert and oriented x 3 PSYCHIATRIC:  Normal affect   Signed, Jenean Lindau, MD  09/04/2018 9:28 AM     [facility]

## 2018-09-04 NOTE — Telephone Encounter (Signed)
-----   Message from Jenean Lindau, MD sent at 09/04/2018  4:47 PM EST ----- Potassium 10 mEq daily and Chem-7 in a week. Jenean Lindau, MD 09/04/2018 4:46 PM

## 2018-09-06 NOTE — Telephone Encounter (Signed)
Do we have records?

## 2018-09-07 DIAGNOSIS — E876 Hypokalemia: Secondary | ICD-10-CM | POA: Diagnosis not present

## 2018-09-07 NOTE — Telephone Encounter (Signed)
The records are on your desk for review.

## 2018-09-08 LAB — BASIC METABOLIC PANEL
BUN / CREAT RATIO: 22 (ref 12–28)
BUN: 22 mg/dL (ref 8–27)
CO2: 30 mmol/L — AB (ref 20–29)
CREATININE: 0.98 mg/dL (ref 0.57–1.00)
Calcium: 10.3 mg/dL (ref 8.7–10.3)
Chloride: 87 mmol/L — ABNORMAL LOW (ref 96–106)
GFR calc Af Amer: 66 mL/min/{1.73_m2} (ref >59)
GFR, EST NON AFRICAN AMERICAN: 57 mL/min/{1.73_m2} — AB (ref >59)
Glucose: 89 mg/dL (ref 65–99)
Potassium: 3.3 mmol/L — ABNORMAL LOW (ref 3.5–5.2)
SODIUM: 131 mmol/L — AB (ref 134–144)

## 2018-09-10 NOTE — Telephone Encounter (Signed)
OK for OV Trisha, Have records available at [facility]

## 2018-09-11 ENCOUNTER — Telehealth: Payer: Self-pay

## 2018-09-11 ENCOUNTER — Encounter: Payer: Self-pay | Admitting: Gastroenterology

## 2018-09-11 DIAGNOSIS — E876 Hypokalemia: Secondary | ICD-10-CM

## 2018-09-11 DIAGNOSIS — I739 Peripheral vascular disease, unspecified: Secondary | ICD-10-CM

## 2018-09-11 DIAGNOSIS — I10 Essential (primary) hypertension: Secondary | ICD-10-CM

## 2018-09-11 NOTE — Telephone Encounter (Signed)
-----   Message from Jenean Lindau, MD sent at 09/11/2018 11:09 AM EST ----- Please check with the patient about her potassium intake.  Double it and recheck in 1 week. Jenean Lindau, MD 09/11/2018 11:09 AM

## 2018-09-11 NOTE — Telephone Encounter (Signed)
Patient called and notified of lab results. 

## 2018-09-11 NOTE — Telephone Encounter (Signed)
Left message for patient to call back and schedule an office visit with Dr.Gupta.

## 2018-09-12 DIAGNOSIS — R6889 Other general symptoms and signs: Secondary | ICD-10-CM | POA: Diagnosis not present

## 2018-09-12 DIAGNOSIS — I10 Essential (primary) hypertension: Secondary | ICD-10-CM | POA: Diagnosis not present

## 2018-09-12 DIAGNOSIS — Z6833 Body mass index (BMI) 33.0-33.9, adult: Secondary | ICD-10-CM | POA: Diagnosis not present

## 2018-09-12 DIAGNOSIS — J208 Acute bronchitis due to other specified organisms: Secondary | ICD-10-CM | POA: Diagnosis not present

## 2018-09-12 DIAGNOSIS — R600 Localized edema: Secondary | ICD-10-CM | POA: Diagnosis not present

## 2018-09-13 ENCOUNTER — Telehealth: Payer: Self-pay

## 2018-09-13 NOTE — Telephone Encounter (Signed)
I called patient and left detailed voice message on patients phone regarding test results.

## 2018-09-19 DIAGNOSIS — R05 Cough: Secondary | ICD-10-CM | POA: Diagnosis not present

## 2018-09-19 DIAGNOSIS — R0602 Shortness of breath: Secondary | ICD-10-CM | POA: Diagnosis not present

## 2018-09-19 DIAGNOSIS — B9689 Other specified bacterial agents as the cause of diseases classified elsewhere: Secondary | ICD-10-CM | POA: Diagnosis not present

## 2018-09-19 DIAGNOSIS — J208 Acute bronchitis due to other specified organisms: Secondary | ICD-10-CM | POA: Diagnosis not present

## 2018-09-25 ENCOUNTER — Encounter: Payer: Self-pay | Admitting: Gastroenterology

## 2018-09-26 ENCOUNTER — Encounter: Payer: Self-pay | Admitting: Gastroenterology

## 2018-09-26 ENCOUNTER — Other Ambulatory Visit: Payer: Self-pay | Admitting: Cardiology

## 2018-09-26 ENCOUNTER — Ambulatory Visit (INDEPENDENT_AMBULATORY_CARE_PROVIDER_SITE_OTHER): Payer: Medicare Other | Admitting: Gastroenterology

## 2018-09-26 VITALS — BP 120/90 | HR 76 | Ht 64.0 in | Wt 186.0 lb

## 2018-09-26 DIAGNOSIS — Z86010 Personal history of colonic polyps: Secondary | ICD-10-CM

## 2018-09-26 NOTE — Progress Notes (Signed)
Chief Complaint:   Referring Provider:  Nicoletta Dress, MD      ASSESSMENT AND PLAN;   #1. H/O polyps  Plan: - Proceed with colonoscopy with ped scope . Discussed risks & benefits. (Risks including rare perforation req laparotomy, bleeding after biopsies/polypectomy req blood transfusion, rare chance of missing neoplasms, risks of anesthesia/sedation). Benefits outweigh the risks. Patient agrees to proceed. All the questions were answered. Consent forms given for review.    HPI:    Rachel Mata is a 73 y.o. female  No nausea, vomiting, heartburn, regurgitation, odynophagia or dysphagia.  No significant diarrhea or constipation (better with fiber and increased fluid intake).  There is no melena or hematochezia. No unintentional weight loss.  History of colonic polyps in the past.  On review had colonoscopy November 2014 by Dr. Jae Dire was difficult, pediatric colonoscope.  Recommended to get it repeated in 5 years.   Past Medical History:  Diagnosis Date  . Anemia   . Colon polyps 10/17/2017  . Depression 10/17/2017  . Diabetes mellitus without complication ([place])   . Gastritis 10/17/2017  . GERD (gastroesophageal reflux disease) 10/17/2017  . Hiatal hernia 10/17/2017  . Hyperlipidemia   . Hypertension   . Osteoarthritis 10/17/2017  . Peripheral neuropathy 10/17/2017  . Peripheral vascular disease ([place])   . PVD (peripheral vascular disease) ([place]) 10/17/2017  . Septic olecranon bursitis 10/17/2017  . Skin cancer 10/17/2017  . Sleep apnea, obstructive 10/17/2017  . Stress incontinence of urine 08/09/2017  . Subacute vulvitis 08/09/2017  . Syncope 10/17/2017    Past Surgical History:  Procedure Laterality Date  . ABDOMINAL HYSTERECTOMY    . BLADDER SURGERY    . CHOLECYSTECTOMY    . COLONOSCOPY  05/28/2013   No polyps found. There was a spasm and muscular atrophy in the sigmoid colon. The cecum could not be identified from the distance but was not intubated. The patient had an  increased pain during the procedure and had mild vagal reaction.   . ESOPHAGOGASTRODUODENOSCOPY  04/28/2012   Esophageal spasm. Bile reflux gastritis. Medium hiatal hernia.   Marland Kitchen PARAESOPHAGEAL HERNIA REPAIR    . RECTOCELE REPAIR      Family History  Problem Relation Age of Onset  . Emphysema Mother   . Asthma Mother   . COPD Mother   . Prostate cancer Father   . Heart attack Father   . Heart disease Father   . Hypercholesterolemia Sister   . Heart attack Maternal Grandmother   . Heart attack Maternal Grandfather   . Diabetes Paternal Grandmother   . Diabetes Paternal Grandfather     Social History   Tobacco Use  . Smoking status: Never Smoker  . Smokeless tobacco: Never Used  Substance Use Topics  . Alcohol use: Yes    Comment: occasional wine  . Drug use: Never    Current Outpatient Medications  Medication Sig Dispense Refill  . amLODipine (NORVASC) 5 MG tablet Take 5 mg by mouth daily.    Marland Kitchen esomeprazole (NEXIUM) 40 MG capsule Take 40 mg by mouth daily at 12 noon.    . fesoterodine (TOVIAZ) 4 MG TB24 tablet Take 4 mg by mouth daily.    . furosemide (LASIX) 40 MG tablet Take 40 mg by mouth daily.    Marland Kitchen losartan (COZAAR) 100 MG tablet Take 100 mg by mouth daily.    . potassium chloride (K-DUR) 10 MEQ tablet TAKE 1 TABLET BY MOUTH EVERY DAY 90 tablet 1  . primidone (MYSOLINE)  50 MG tablet Take by mouth 2 (two) times daily.    . promethazine (PHENERGAN) 25 MG tablet Take 25 mg by mouth every 6 (six) hours as needed for nausea or vomiting.    . propranolol (INDERAL) 10 MG tablet Take 10 mg by mouth 3 (three) times daily.  3  . Psyllium (METAMUCIL SMOOTH TEXTURE) 28.3 % POWD Take by mouth 2 (two) times daily as needed.     . simvastatin (ZOCOR) 20 MG tablet Take 20 mg by mouth daily.    . traMADol (ULTRAM) 50 MG tablet Take by mouth every 6 (six) hours as needed.    . venlafaxine XR (EFFEXOR-XR) 150 MG 24 hr capsule Take 150 mg by mouth daily with breakfast.     No current  facility-administered medications for this visit.     Allergies  Allergen Reactions  . Penicillin G Other (See Comments)    Pt states it doesn't work    Review of Systems:  Constitutional: Denies fever, chills, diaphoresis, appetite change and fatigue.  HEENT: Denies photophobia, eye pain, redness, hearing loss, ear pain, congestion, sore throat, rhinorrhea, sneezing, mouth sores, neck pain, neck stiffness and tinnitus.   Respiratory: Denies SOB, DOE, cough, chest tightness,  and wheezing.   Cardiovascular: Denies chest pain, palpitations and leg swelling.  Genitourinary: Denies dysuria, urgency, frequency, hematuria, flank pain and difficulty urinating.  Musculoskeletal: Has arthritis, back pain Skin: No rash.  Neurological: Denies dizziness, seizures, syncope, weakness, light-headedness, numbness and headaches.  Hematological: Denies adenopathy. Easy bruising, personal or family bleeding history  Psychiatric/Behavioral: has  anxiety or depression     Physical Exam:    BP 120/90   Pulse 76   Ht 5\' 4"  (1.626 m)   Wt 186 lb (84.4 kg)   BMI 31.93 kg/m  Filed Weights   09/26/18 0904  Weight: 186 lb (84.4 kg)   Constitutional:  Well-developed, in no acute distress. Psychiatric: Normal mood and affect. Behavior is normal. HEENT: Pupils normal.  Conjunctivae are normal. No scleral icterus. Neck supple.  Cardiovascular: Normal rate, regular rhythm. No edema Pulmonary/chest: Effort normal and breath sounds normal. No wheezing, rales or rhonchi. Abdominal: Soft, nondistended. Nontender. Bowel sounds active throughout. There are no masses palpable. No hepatomegaly. Rectal:  defered Neurological: Alert and oriented to person place and time. Skin: Skin is warm and dry. No rashes noted.  Data Reviewed: I have personally reviewed following labs and imaging studies  CBC: No flowsheet data found.   [name], MD 09/26/2018, 9:15 AM  Cc: Nicoletta Dress, MD

## 2018-09-26 NOTE — Patient Instructions (Signed)
If you are age 73 or older, your body mass index should be between 23-30. Your Body mass index is 31.93 kg/m. If this is out of the aforementioned range listed, please consider follow up with your Primary Care Provider.  If you are age 72 or younger, your body mass index should be between 19-25. Your Body mass index is 31.93 kg/m. If this is out of the aformentioned range listed, please consider follow up with your Primary Care Provider.   You have been scheduled for a colonoscopy. Please follow written instructions given to you at your visit today.  Please pick up your prep supplies at the pharmacy within the next 1-3 days. If you use inhalers (even only as needed), please bring them with you on the day of your procedure. Your physician has requested that you go to www.startemmi.com and enter the access code given to you at your visit today. This web site gives a general overview about your procedure. However, you should still follow specific instructions given to you by our office regarding your preparation for the procedure.  Thank you,  Dr. Jackquline Denmark

## 2018-09-30 DIAGNOSIS — D509 Iron deficiency anemia, unspecified: Secondary | ICD-10-CM | POA: Diagnosis not present

## 2018-09-30 DIAGNOSIS — F418 Other specified anxiety disorders: Secondary | ICD-10-CM | POA: Diagnosis not present

## 2018-09-30 DIAGNOSIS — E1142 Type 2 diabetes mellitus with diabetic polyneuropathy: Secondary | ICD-10-CM | POA: Diagnosis not present

## 2018-09-30 DIAGNOSIS — Z9181 History of falling: Secondary | ICD-10-CM | POA: Diagnosis not present

## 2018-09-30 DIAGNOSIS — G250 Essential tremor: Secondary | ICD-10-CM | POA: Diagnosis not present

## 2018-09-30 DIAGNOSIS — E785 Hyperlipidemia, unspecified: Secondary | ICD-10-CM | POA: Diagnosis not present

## 2018-09-30 DIAGNOSIS — I10 Essential (primary) hypertension: Secondary | ICD-10-CM | POA: Diagnosis not present

## 2018-10-24 ENCOUNTER — Encounter: Payer: Medicare Other | Admitting: Gastroenterology

## 2018-12-14 ENCOUNTER — Telehealth: Payer: Self-pay | Admitting: Gastroenterology

## 2018-12-14 NOTE — Telephone Encounter (Signed)
Pt just rescheduled from 4-7 to 6-15 for a colonoscopy can you please resend instructions

## 2018-12-14 NOTE — Telephone Encounter (Signed)
I have mailed patient new instructions.

## 2018-12-21 ENCOUNTER — Encounter: Payer: Self-pay | Admitting: Gastroenterology

## 2018-12-28 ENCOUNTER — Encounter: Payer: Medicare Other | Admitting: Gastroenterology

## 2018-12-29 ENCOUNTER — Telehealth: Payer: Self-pay

## 2018-12-29 NOTE — Telephone Encounter (Signed)
Covid-19 screening questions  Have you traveled in the last 14 days? No. If yes where?  Do you now or have you had a fever in the last 14 days? No.  Do you have any respiratory symptoms of shortness of breath or cough now or in the last 14 days? No.  Do you have any family members or close contacts with diagnosed or suspected Covid-19 in the past 14 days? No.  Have you been tested for Covid-19 and found to be positive? No.       

## 2018-12-29 NOTE — Telephone Encounter (Signed)
Left message with a call back number

## 2019-01-01 ENCOUNTER — Ambulatory Visit (AMBULATORY_SURGERY_CENTER): Payer: Medicare Other | Admitting: Gastroenterology

## 2019-01-01 ENCOUNTER — Other Ambulatory Visit: Payer: Self-pay

## 2019-01-01 ENCOUNTER — Encounter: Payer: Self-pay | Admitting: Gastroenterology

## 2019-01-01 VITALS — BP 100/70 | HR 60 | Temp 97.8°F | Resp 27 | Ht 64.0 in | Wt 186.0 lb

## 2019-01-01 DIAGNOSIS — D122 Benign neoplasm of ascending colon: Secondary | ICD-10-CM | POA: Diagnosis not present

## 2019-01-01 DIAGNOSIS — D123 Benign neoplasm of transverse colon: Secondary | ICD-10-CM

## 2019-01-01 DIAGNOSIS — Z86010 Personal history of colonic polyps: Secondary | ICD-10-CM

## 2019-01-01 DIAGNOSIS — D128 Benign neoplasm of rectum: Secondary | ICD-10-CM | POA: Diagnosis not present

## 2019-01-01 MED ORDER — SODIUM CHLORIDE 0.9 % IV SOLN: 500.0000 mL | Freq: Once | INTRAVENOUS | Status: DC

## 2019-01-01 NOTE — Progress Notes (Signed)
PT taken to PACU. Monitors in place. VSS. Report given to RN. 

## 2019-01-01 NOTE — Patient Instructions (Signed)
Information on polyps and diverticulosis and hemorrhoids given to you today.  Await pathology results.  Eat a high fiber diet.  YOU HAD AN ENDOSCOPIC PROCEDURE TODAY AT [facility]:   Refer to the procedure report that was given to you for any specific questions about what was found during the examination.  If the procedure report does not answer your questions, please call your gastroenterologist to clarify.  If you requested that your care partner not be given the details of your procedure findings, then the procedure report has been included in a sealed envelope for you to review at your convenience later.  YOU SHOULD EXPECT: Some feelings of bloating in the abdomen. Passage of more gas than usual.  Walking can help get rid of the air that was put into your GI tract during the procedure and reduce the bloating. If you had a lower endoscopy (such as a colonoscopy or flexible sigmoidoscopy) you may notice spotting of blood in your stool or on the toilet paper. If you underwent a bowel prep for your procedure, you may not have a normal bowel movement for a few days.  Please Note:  You might notice some irritation and congestion in your nose or some drainage.  This is from the oxygen used during your procedure.  There is no need for concern and it should clear up in a day or so.  SYMPTOMS TO REPORT IMMEDIATELY:   Following lower endoscopy (colonoscopy or flexible sigmoidoscopy):  Excessive amounts of blood in the stool  Significant tenderness or worsening of abdominal pains  Swelling of the abdomen that is new, acute  Fever of 100F or higher   Black, tarry-looking stools  For urgent or emergent issues, a gastroenterologist can be reached at any hour by calling [phone].   DIET:  We do recommend a small meal at first, but then you may proceed to your regular diet.  Drink plenty of fluids but you should avoid alcoholic beverages for 24 hours.  ACTIVITY:  You should  plan to take it easy for the rest of today and you should NOT DRIVE or use heavy machinery until tomorrow (because of the sedation medicines used during the test).    FOLLOW UP: Our staff will call the number listed on your records 48-72 hours following your procedure to check on you and address any questions or concerns that you may have regarding the information given to you following your procedure. If we do not reach you, we will leave a message.  We will attempt to reach you two times.  During this call, we will ask if you have developed any symptoms of COVID 19. If you develop any symptoms (ie: fever, flu-like symptoms, shortness of breath, cough etc.) before then, please call [phone].  If you test positive for Covid 19 in the 2 weeks post procedure, please call and report this information to [place].    If any biopsies were taken you will be contacted by phone or by letter within the next 1-3 weeks.  Please call us at [phone] if you have not heard about the biopsies in 3 weeks.    SIGNATURES/CONFIDENTIALITY: You and/or your care partner have signed paperwork which will be entered into your electronic medical record.  These signatures attest to the fact that that the information above on your After Visit Summary has been reviewed and is understood.  Full responsibility of the confidentiality of this discharge information lies with you and/or your care-partner.

## 2019-01-01 NOTE — Progress Notes (Signed)
Pt's states no medical or surgical changes since previsit or office visit.  CW temp JB vitals

## 2019-01-01 NOTE — Progress Notes (Signed)
Called to room to assist during endoscopic procedure.  Patient ID and intended procedure confirmed with present staff. Received instructions for my participation in the procedure from the performing physician.  

## 2019-01-01 NOTE — Op Note (Signed)
[facility] Patient Name: Rachel Mata Procedure Date: 01/01/2019 7:50 AM MRN: [id number] Endoscopist: Jackquline Denmark , MD Age: 73 Referring MD:  Date of Birth: September 12, 1945 Gender: Female Account #: [id number] Procedure:                Colonoscopy Indications:              Screening for colorectal malignant neoplasm Medicines:                Monitored Anesthesia Care Procedure:                Pre-Anesthesia Assessment:                           - Prior to the procedure, a History and Physical                            was performed, and patient medications and                            allergies were reviewed. The patient's tolerance of                            previous anesthesia was also reviewed. The risks                            and benefits of the procedure and the sedation                            options and risks were discussed with the patient.                            All questions were answered, and informed consent                            was obtained. Prior Anticoagulants: The patient has                            taken no previous anticoagulant or antiplatelet                            agents. ASA Grade Assessment: II - A patient with                            mild systemic disease. After reviewing the risks                            and benefits, the patient was deemed in                            satisfactory condition to undergo the procedure.                           After obtaining informed consent, the colonoscope  was passed under direct vision. Throughout the                            procedure, the patient's blood pressure, pulse, and                            oxygen saturations were monitored continuously. The                            Colonoscope was introduced through the anus and                            advanced to the the cecum, identified by                            appendiceal orifice and  ileocecal valve. The                            colonoscopy was somewhat difficult due to a                            tortuous colon. Successful completion of the                            procedure was aided by applying abdominal pressure.                            The patient tolerated the procedure well. The                            quality of the bowel preparation was adequate to                            identify polyps. The ileocecal valve, appendiceal                            orifice, and rectum were photographed. Scope In: 7:58:59 AM Scope Out: 8:24:51 AM Scope Withdrawal Time: 0 hours 12 minutes 7 seconds  Total Procedure Duration: 0 hours 25 minutes 52 seconds  Findings:                 Four sessile polyps were found in the proximal                            transverse colon and ascending colon. The polyps                            were 6 to 8 mm in size. Smaller polyps were removed                            with cold snare and the larger with hot snare.                            Resection  and retrieval were complete. Estimated                            blood loss: none.                           A 6 mm polyp was found in the rectum. The polyp was                            sessile. The polyp was removed with a cold biopsy                            forceps. Resection and retrieval were complete.                            Estimated blood loss: none.                           A few small-mouthed diverticula were found in the                            sigmoid colon. The sigmoid colon was "fixed".                           Non-bleeding external internal hemorrhoids were                            found during retroflexion. The hemorrhoids were                            small. Complications:            No immediate complications. Estimated Blood Loss:     Estimated blood loss: none. Impression:               -Colonic polyps status post polypectomy.                            -Mild sigmoid diverticulosis.                           -Non-bleeding external internal hemorrhoids. Recommendation:           - Patient has a contact number available for                            emergencies. The signs and symptoms of potential                            delayed complications were discussed with the                            patient. Return to normal activities tomorrow.                            Written discharge instructions were provided to the  patient.                           - Resume previous diet.                           - Continue present medications.                           - High-fiber diet.                           - Await pathology results.                           - Repeat colonoscopy for surveillance based on                            pathology results.                           - Return to GI clinic PRN. Jackquline Denmark, MD 01/01/2019 8:31:52 AM This report has been signed electronically.

## 2019-01-03 ENCOUNTER — Telehealth: Payer: Self-pay

## 2019-01-03 ENCOUNTER — Encounter: Payer: Self-pay | Admitting: Gastroenterology

## 2019-01-03 NOTE — Telephone Encounter (Signed)
  Follow up Call-  Call back number 01/01/2019  Post procedure Call Back phone  # [phone]  Permission to leave phone message Yes  Some recent data might be hidden     No ID on voicemail. No message left. Will try again midday.

## 2019-01-24 DIAGNOSIS — Z1231 Encounter for screening mammogram for malignant neoplasm of breast: Secondary | ICD-10-CM | POA: Diagnosis not present

## 2019-02-09 DIAGNOSIS — L049 Acute lymphadenitis, unspecified: Secondary | ICD-10-CM | POA: Diagnosis not present

## 2019-02-09 DIAGNOSIS — H40013 Open angle with borderline findings, low risk, bilateral: Secondary | ICD-10-CM | POA: Diagnosis not present

## 2019-02-19 ENCOUNTER — Ambulatory Visit: Payer: Medicare Other | Admitting: Cardiology

## 2019-03-05 ENCOUNTER — Ambulatory Visit: Payer: Medicare Other | Admitting: Cardiology

## 2019-04-07 DIAGNOSIS — G250 Essential tremor: Secondary | ICD-10-CM | POA: Diagnosis not present

## 2019-04-07 DIAGNOSIS — F418 Other specified anxiety disorders: Secondary | ICD-10-CM | POA: Diagnosis not present

## 2019-04-07 DIAGNOSIS — E1142 Type 2 diabetes mellitus with diabetic polyneuropathy: Secondary | ICD-10-CM | POA: Diagnosis not present

## 2019-04-07 DIAGNOSIS — I10 Essential (primary) hypertension: Secondary | ICD-10-CM | POA: Diagnosis not present

## 2019-04-07 DIAGNOSIS — E785 Hyperlipidemia, unspecified: Secondary | ICD-10-CM | POA: Diagnosis not present

## 2019-04-07 DIAGNOSIS — Z23 Encounter for immunization: Secondary | ICD-10-CM | POA: Diagnosis not present

## 2019-04-11 DIAGNOSIS — Z136 Encounter for screening for cardiovascular disorders: Secondary | ICD-10-CM | POA: Diagnosis not present

## 2019-04-11 DIAGNOSIS — N959 Unspecified menopausal and perimenopausal disorder: Secondary | ICD-10-CM | POA: Diagnosis not present

## 2019-04-11 DIAGNOSIS — Z9181 History of falling: Secondary | ICD-10-CM | POA: Diagnosis not present

## 2019-04-11 DIAGNOSIS — E669 Obesity, unspecified: Secondary | ICD-10-CM | POA: Diagnosis not present

## 2019-04-11 DIAGNOSIS — Z0000 Encounter for general adult medical examination without abnormal findings: Secondary | ICD-10-CM | POA: Diagnosis not present

## 2019-04-11 DIAGNOSIS — Z139 Encounter for screening, unspecified: Secondary | ICD-10-CM | POA: Diagnosis not present

## 2019-04-11 DIAGNOSIS — Z1331 Encounter for screening for depression: Secondary | ICD-10-CM | POA: Diagnosis not present

## 2019-04-11 DIAGNOSIS — Z6832 Body mass index (BMI) 32.0-32.9, adult: Secondary | ICD-10-CM | POA: Diagnosis not present

## 2019-04-11 DIAGNOSIS — E785 Hyperlipidemia, unspecified: Secondary | ICD-10-CM | POA: Diagnosis not present

## 2019-04-14 DIAGNOSIS — D509 Iron deficiency anemia, unspecified: Secondary | ICD-10-CM | POA: Diagnosis not present

## 2019-04-14 DIAGNOSIS — E785 Hyperlipidemia, unspecified: Secondary | ICD-10-CM | POA: Diagnosis not present

## 2019-04-14 DIAGNOSIS — E1142 Type 2 diabetes mellitus with diabetic polyneuropathy: Secondary | ICD-10-CM | POA: Diagnosis not present

## 2019-04-17 DIAGNOSIS — L570 Actinic keratosis: Secondary | ICD-10-CM | POA: Diagnosis not present

## 2019-04-17 DIAGNOSIS — L578 Other skin changes due to chronic exposure to nonionizing radiation: Secondary | ICD-10-CM | POA: Diagnosis not present

## 2019-04-17 DIAGNOSIS — L821 Other seborrheic keratosis: Secondary | ICD-10-CM | POA: Diagnosis not present

## 2019-04-17 DIAGNOSIS — D1801 Hemangioma of skin and subcutaneous tissue: Secondary | ICD-10-CM | POA: Diagnosis not present

## 2019-05-10 DIAGNOSIS — N952 Postmenopausal atrophic vaginitis: Secondary | ICD-10-CM | POA: Diagnosis not present

## 2019-05-10 DIAGNOSIS — N3281 Overactive bladder: Secondary | ICD-10-CM | POA: Diagnosis not present

## 2019-06-26 DIAGNOSIS — N952 Postmenopausal atrophic vaginitis: Secondary | ICD-10-CM | POA: Diagnosis not present

## 2019-06-26 DIAGNOSIS — N3281 Overactive bladder: Secondary | ICD-10-CM | POA: Diagnosis not present

## 2019-09-24 DIAGNOSIS — N3281 Overactive bladder: Secondary | ICD-10-CM | POA: Diagnosis not present

## 2019-09-24 DIAGNOSIS — R351 Nocturia: Secondary | ICD-10-CM | POA: Diagnosis not present

## 2019-10-06 DIAGNOSIS — I10 Essential (primary) hypertension: Secondary | ICD-10-CM | POA: Diagnosis not present

## 2019-10-06 DIAGNOSIS — G250 Essential tremor: Secondary | ICD-10-CM | POA: Diagnosis not present

## 2019-10-06 DIAGNOSIS — D509 Iron deficiency anemia, unspecified: Secondary | ICD-10-CM | POA: Diagnosis not present

## 2019-10-06 DIAGNOSIS — F418 Other specified anxiety disorders: Secondary | ICD-10-CM | POA: Diagnosis not present

## 2019-10-06 DIAGNOSIS — E785 Hyperlipidemia, unspecified: Secondary | ICD-10-CM | POA: Diagnosis not present

## 2019-10-06 DIAGNOSIS — E1142 Type 2 diabetes mellitus with diabetic polyneuropathy: Secondary | ICD-10-CM | POA: Diagnosis not present

## 2019-10-16 ENCOUNTER — Encounter (INDEPENDENT_AMBULATORY_CARE_PROVIDER_SITE_OTHER): Payer: Self-pay

## 2019-12-12 DIAGNOSIS — D1801 Hemangioma of skin and subcutaneous tissue: Secondary | ICD-10-CM | POA: Diagnosis not present

## 2019-12-12 DIAGNOSIS — L814 Other melanin hyperpigmentation: Secondary | ICD-10-CM | POA: Diagnosis not present

## 2019-12-12 DIAGNOSIS — L821 Other seborrheic keratosis: Secondary | ICD-10-CM | POA: Diagnosis not present

## 2019-12-12 DIAGNOSIS — L578 Other skin changes due to chronic exposure to nonionizing radiation: Secondary | ICD-10-CM | POA: Diagnosis not present

## 2019-12-12 DIAGNOSIS — C44311 Basal cell carcinoma of skin of nose: Secondary | ICD-10-CM | POA: Diagnosis not present

## 2019-12-13 DIAGNOSIS — M25552 Pain in left hip: Secondary | ICD-10-CM | POA: Diagnosis not present

## 2019-12-13 DIAGNOSIS — M25551 Pain in right hip: Secondary | ICD-10-CM | POA: Diagnosis not present

## 2019-12-19 DIAGNOSIS — M47817 Spondylosis without myelopathy or radiculopathy, lumbosacral region: Secondary | ICD-10-CM | POA: Diagnosis not present

## 2019-12-19 DIAGNOSIS — M5136 Other intervertebral disc degeneration, lumbar region: Secondary | ICD-10-CM | POA: Diagnosis not present

## 2019-12-19 DIAGNOSIS — M25551 Pain in right hip: Secondary | ICD-10-CM | POA: Diagnosis not present

## 2019-12-19 DIAGNOSIS — M25552 Pain in left hip: Secondary | ICD-10-CM | POA: Diagnosis not present

## 2019-12-19 DIAGNOSIS — M5186 Other intervertebral disc disorders, lumbar region: Secondary | ICD-10-CM | POA: Diagnosis not present

## 2019-12-19 DIAGNOSIS — M47816 Spondylosis without myelopathy or radiculopathy, lumbar region: Secondary | ICD-10-CM | POA: Diagnosis not present

## 2019-12-26 DIAGNOSIS — C44321 Squamous cell carcinoma of skin of nose: Secondary | ICD-10-CM | POA: Diagnosis not present

## 2020-01-24 DIAGNOSIS — Z1231 Encounter for screening mammogram for malignant neoplasm of breast: Secondary | ICD-10-CM | POA: Diagnosis not present

## 2020-01-30 DIAGNOSIS — M545 Low back pain: Secondary | ICD-10-CM | POA: Diagnosis not present

## 2020-01-30 DIAGNOSIS — M5416 Radiculopathy, lumbar region: Secondary | ICD-10-CM | POA: Diagnosis not present

## 2020-02-06 DIAGNOSIS — C44311 Basal cell carcinoma of skin of nose: Secondary | ICD-10-CM | POA: Diagnosis not present

## 2020-02-15 DIAGNOSIS — M542 Cervicalgia: Secondary | ICD-10-CM | POA: Diagnosis not present

## 2020-02-15 DIAGNOSIS — M5030 Other cervical disc degeneration, unspecified cervical region: Secondary | ICD-10-CM | POA: Diagnosis not present

## 2020-02-15 DIAGNOSIS — M47812 Spondylosis without myelopathy or radiculopathy, cervical region: Secondary | ICD-10-CM | POA: Diagnosis not present

## 2020-03-21 DIAGNOSIS — K3184 Gastroparesis: Secondary | ICD-10-CM | POA: Diagnosis not present

## 2020-03-21 DIAGNOSIS — E1143 Type 2 diabetes mellitus with diabetic autonomic (poly)neuropathy: Secondary | ICD-10-CM | POA: Diagnosis not present

## 2020-04-02 DIAGNOSIS — E1142 Type 2 diabetes mellitus with diabetic polyneuropathy: Secondary | ICD-10-CM | POA: Diagnosis not present

## 2020-04-02 DIAGNOSIS — M5135 Other intervertebral disc degeneration, thoracolumbar region: Secondary | ICD-10-CM | POA: Diagnosis not present

## 2020-04-02 DIAGNOSIS — D509 Iron deficiency anemia, unspecified: Secondary | ICD-10-CM | POA: Diagnosis not present

## 2020-04-02 DIAGNOSIS — F418 Other specified anxiety disorders: Secondary | ICD-10-CM | POA: Diagnosis not present

## 2020-04-02 DIAGNOSIS — G250 Essential tremor: Secondary | ICD-10-CM | POA: Diagnosis not present

## 2020-04-02 DIAGNOSIS — I10 Essential (primary) hypertension: Secondary | ICD-10-CM | POA: Diagnosis not present

## 2020-04-02 DIAGNOSIS — E785 Hyperlipidemia, unspecified: Secondary | ICD-10-CM | POA: Diagnosis not present

## 2020-04-22 DIAGNOSIS — L821 Other seborrheic keratosis: Secondary | ICD-10-CM | POA: Diagnosis not present

## 2020-04-22 DIAGNOSIS — D1801 Hemangioma of skin and subcutaneous tissue: Secondary | ICD-10-CM | POA: Diagnosis not present

## 2020-05-09 DIAGNOSIS — M792 Neuralgia and neuritis, unspecified: Secondary | ICD-10-CM | POA: Diagnosis not present

## 2020-05-09 DIAGNOSIS — Z6831 Body mass index (BMI) 31.0-31.9, adult: Secondary | ICD-10-CM | POA: Diagnosis not present

## 2020-05-09 DIAGNOSIS — Z23 Encounter for immunization: Secondary | ICD-10-CM | POA: Diagnosis not present

## 2020-07-28 DIAGNOSIS — K2990 Gastroduodenitis, unspecified, without bleeding: Secondary | ICD-10-CM | POA: Diagnosis not present

## 2020-07-28 DIAGNOSIS — K219 Gastro-esophageal reflux disease without esophagitis: Secondary | ICD-10-CM | POA: Diagnosis not present

## 2020-07-28 DIAGNOSIS — K222 Esophageal obstruction: Secondary | ICD-10-CM | POA: Diagnosis not present

## 2020-08-05 DIAGNOSIS — K219 Gastro-esophageal reflux disease without esophagitis: Secondary | ICD-10-CM | POA: Diagnosis not present

## 2020-08-05 DIAGNOSIS — K222 Esophageal obstruction: Secondary | ICD-10-CM | POA: Diagnosis not present

## 2020-08-05 DIAGNOSIS — Z0000 Encounter for general adult medical examination without abnormal findings: Secondary | ICD-10-CM | POA: Diagnosis not present

## 2020-08-05 DIAGNOSIS — Z1331 Encounter for screening for depression: Secondary | ICD-10-CM | POA: Diagnosis not present

## 2020-08-05 DIAGNOSIS — G250 Essential tremor: Secondary | ICD-10-CM | POA: Diagnosis not present

## 2020-08-05 DIAGNOSIS — R195 Other fecal abnormalities: Secondary | ICD-10-CM | POA: Diagnosis not present

## 2020-08-05 DIAGNOSIS — K2990 Gastroduodenitis, unspecified, without bleeding: Secondary | ICD-10-CM | POA: Diagnosis not present

## 2020-08-05 DIAGNOSIS — F418 Other specified anxiety disorders: Secondary | ICD-10-CM | POA: Diagnosis not present

## 2020-08-05 DIAGNOSIS — E785 Hyperlipidemia, unspecified: Secondary | ICD-10-CM | POA: Diagnosis not present

## 2020-08-05 DIAGNOSIS — Z9181 History of falling: Secondary | ICD-10-CM | POA: Diagnosis not present

## 2020-08-07 DIAGNOSIS — R195 Other fecal abnormalities: Secondary | ICD-10-CM | POA: Diagnosis not present

## 2020-08-11 DIAGNOSIS — K5730 Diverticulosis of large intestine without perforation or abscess without bleeding: Secondary | ICD-10-CM | POA: Diagnosis not present

## 2020-08-11 DIAGNOSIS — K5900 Constipation, unspecified: Secondary | ICD-10-CM | POA: Diagnosis not present

## 2020-08-11 DIAGNOSIS — K219 Gastro-esophageal reflux disease without esophagitis: Secondary | ICD-10-CM | POA: Diagnosis not present

## 2020-08-11 DIAGNOSIS — R1013 Epigastric pain: Secondary | ICD-10-CM | POA: Diagnosis not present

## 2020-08-12 DIAGNOSIS — Z20828 Contact with and (suspected) exposure to other viral communicable diseases: Secondary | ICD-10-CM | POA: Diagnosis not present

## 2020-08-19 DIAGNOSIS — K2970 Gastritis, unspecified, without bleeding: Secondary | ICD-10-CM | POA: Diagnosis not present

## 2020-08-19 DIAGNOSIS — K222 Esophageal obstruction: Secondary | ICD-10-CM | POA: Diagnosis not present

## 2020-08-19 DIAGNOSIS — R112 Nausea with vomiting, unspecified: Secondary | ICD-10-CM | POA: Diagnosis not present

## 2020-08-19 DIAGNOSIS — K317 Polyp of stomach and duodenum: Secondary | ICD-10-CM | POA: Diagnosis not present

## 2020-08-19 DIAGNOSIS — K449 Diaphragmatic hernia without obstruction or gangrene: Secondary | ICD-10-CM | POA: Diagnosis not present

## 2020-08-19 DIAGNOSIS — R1013 Epigastric pain: Secondary | ICD-10-CM | POA: Diagnosis not present

## 2020-08-19 DIAGNOSIS — R1310 Dysphagia, unspecified: Secondary | ICD-10-CM | POA: Diagnosis not present

## 2020-08-19 HISTORY — PX: UPPER GASTROINTESTINAL ENDOSCOPY: SHX188

## 2020-08-26 ENCOUNTER — Ambulatory Visit (INDEPENDENT_AMBULATORY_CARE_PROVIDER_SITE_OTHER): Payer: Medicare Other | Admitting: Gastroenterology

## 2020-08-26 ENCOUNTER — Encounter: Payer: Self-pay | Admitting: Gastroenterology

## 2020-08-26 VITALS — BP 98/62 | HR 67 | Ht 63.0 in | Wt 177.0 lb

## 2020-08-26 DIAGNOSIS — R1319 Other dysphagia: Secondary | ICD-10-CM | POA: Diagnosis not present

## 2020-08-26 DIAGNOSIS — K5900 Constipation, unspecified: Secondary | ICD-10-CM

## 2020-08-26 NOTE — Progress Notes (Signed)
Chief Complaint:   Referring Provider:  Nicoletta Dress, MD      ASSESSMENT AND PLAN;   #1. Chronic constipation. Likely exacerbated by medications.  #2. H/O eso dysphagia s/p EGD with dil 17 mm savory with resolution of dysphagia (Dr. Lyda Jester) 08/19/2020. EGD- the eso was tortuous, gastric polyps (Bx-fundic gland polyps), large hiatal hernia, previous history of paraesophageal hernia repair.  #3. H/O polyps  Plan:  - Benefiber 1 TBS po qd with 8 oz water - If still problems, CT AP with p.o. and IV contrast - Call in 12 weeks - Rpt colon 12/2021      HPI:    Kaneesha Constantino is a 75 y.o. female  With HTN, PVD, diet-controlled DM with peripheral neuropathy, hiatal hernia, HLD  C/O "earthworn like stools" with decreased caliber and occasional straining. Over the last several years. More prominently over the last 3 to 4 weeks. She has lower abdominal discomfort which gets better with defecation. No weight loss. No melena or hematochezia.  We have gone over last colonoscopy in detail.  No further dysphagia after dilation.  She has increased water intake.  Previous GI procedures: Colonoscopy 12/31/2020 -Colonic polyps status post polypectomy. Bx-tubular adenomas -Mild sigmoid diverticulosis. Highly tortuous colon. " Fixed sigmoid" -Non-bleeding external internal hemorrhoids.  Colonoscopy November 2014: Difficult procedure, peds scope. Dr. Melina Copa  EGD with Dil 08/19/2020 Dr. 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 Past Medical History:  Diagnosis Date  . Anemia   . Colon polyps 10/17/2017  . Depression 10/17/2017  . Diabetes mellitus without complication ([place]) 9702   diet controlled  . Essential tremor 2019  . Gastritis 10/17/2017  . GERD (gastroesophageal reflux disease) 10/17/2017  . Hiatal hernia 10/17/2017  . Hyperlipidemia   . Hypertension   .  Osteoarthritis 10/17/2017  . Peripheral neuropathy 10/17/2017  . Peripheral vascular disease ([place])   . PVD (peripheral vascular disease) ([place]) 10/17/2017  . Septic olecranon bursitis 10/17/2017  . Skin cancer 10/17/2017  . Sleep apnea 2010   does not use cpap  . Sleep apnea, obstructive 10/17/2017  . Stress incontinence of urine 08/09/2017  . Subacute vulvitis 08/09/2017  . Syncope 10/17/2017    Past Surgical History:  Procedure Laterality Date  . ABDOMINAL HYSTERECTOMY    . BLADDER SURGERY    . CHOLECYSTECTOMY    . COLONOSCOPY  05/28/2013   No polyps found. There was a spasm and muscular atrophy in the sigmoid colon. The cecum could not be identified from the distance but was not intubated. The patient had an increased pain during the procedure and had mild vagal reaction.   . ESOPHAGOGASTRODUODENOSCOPY  04/28/2012   Esophageal spasm. Bile reflux gastritis. Medium hiatal hernia.   Marland Kitchen PARAESOPHAGEAL HERNIA REPAIR    . RECTOCELE REPAIR    . UPPER GASTROINTESTINAL ENDOSCOPY      Family History  Problem Relation Age of Onset  . Emphysema Mother   . Asthma Mother   . COPD Mother   . Prostate cancer Father   . Heart attack Father   . Heart disease Father   . Hypercholesterolemia Sister   . Colon polyps Sister   . Heart attack Maternal Grandmother   . Heart attack Maternal Grandfather   . Diabetes Paternal Grandmother   . Diabetes Paternal Grandfather   . Colon cancer Neg Hx   . Esophageal cancer Neg Hx   . Stomach cancer Neg Hx   .  Rectal cancer Neg Hx     Social History   Tobacco Use  . Smoking status: Never Smoker  . Smokeless tobacco: Never Used  Vaping Use  . Vaping Use: Never used  Substance Use Topics  . Alcohol use: Yes    Comment: occasional wine  . Drug use: Never    Current Outpatient Medications  Medication Sig Dispense Refill  . amLODipine (NORVASC) 5 MG tablet Take 5 mg by mouth daily.    Marland Kitchen esomeprazole (NEXIUM) 40 MG capsule Take 40 mg by mouth daily at 12  noon.    . fesoterodine (TOVIAZ) 4 MG TB24 tablet Take 4 mg by mouth daily.    . furosemide (LASIX) 40 MG tablet Take 40 mg by mouth daily.    Marland Kitchen losartan (COZAAR) 100 MG tablet Take 100 mg by mouth daily.    . primidone (MYSOLINE) 50 MG tablet Take by mouth 2 (two) times daily.    . propranolol (INDERAL) 10 MG tablet Take 10 mg by mouth 3 (three) times daily.  3  . simvastatin (ZOCOR) 20 MG tablet Take 20 mg by mouth daily.    Marland Kitchen venlafaxine XR (EFFEXOR-XR) 150 MG 24 hr capsule Take 150 mg by mouth daily with breakfast.     No current facility-administered medications for this visit.    Allergies  Allergen Reactions  . Penicillin G Other (See Comments)    Pt states it doesn't work    Review of Systems:  Constitutional: Denies fever, chills, diaphoresis, appetite change and fatigue.  HEENT: Denies photophobia, eye pain, redness, hearing loss, ear pain, congestion, sore throat, rhinorrhea, sneezing, mouth sores, neck pain, neck stiffness and tinnitus.   Respiratory: Denies SOB, DOE, cough, chest tightness,  and wheezing.   Cardiovascular: Denies chest pain, palpitations and leg swelling.  Genitourinary: Denies dysuria, urgency, frequency, hematuria, flank pain and difficulty urinating.  Musculoskeletal: Has arthritis, back pain Skin: No rash.  Neurological: Denies dizziness, seizures, syncope, weakness, light-headedness, numbness and headaches.  Hematological: Denies adenopathy. Easy bruising, personal or family bleeding history  Psychiatric/Behavioral: has  anxiety or depression     Physical Exam:    BP 98/62 (BP Location: Left Arm, Patient Position: Sitting, Cuff Size: Large)   Pulse 67   Ht 5\' 3"  (1.6 m)   Wt 177 lb (80.3 kg)   BMI 31.35 kg/m  Filed Weights   08/26/20 1423  Weight: 177 lb (80.3 kg)   Constitutional:  Well-developed, in no acute distress. Psychiatric: Normal mood and affect. Behavior is normal. HEENT: Pupils normal.  Conjunctivae are normal. No scleral  icterus. Neck supple.  Cardiovascular: Normal rate, regular rhythm. No edema Pulmonary/chest: Effort normal and breath sounds normal. No wheezing, rales or rhonchi. Abdominal: Soft, nondistended. Nontender. Bowel sounds active throughout. There are no masses palpable. No hepatomegaly. Rectal:  defered Neurological: Alert and oriented to person place and time. Skin: Skin is warm and dry. No rashes noted.  Data Reviewed: I have personally reviewed following labs and imaging studies  CBC: No flowsheet data found.   [name], MD 08/26/2020, 2:42 PM  Cc: Nicoletta Dress, MD

## 2020-08-26 NOTE — Patient Instructions (Signed)
If you are age 75 or older, your body mass index should be between 23-30. Your Body mass index is 31.35 kg/m. If this is out of the aforementioned range listed, please consider follow up with your Primary Care Provider.  If you are age 6 or younger, your body mass index should be between 19-25. Your Body mass index is 31.35 kg/m. If this is out of the aformentioned range listed, please consider follow up with your Primary Care Provider.   Please purchase the following medications over the counter and take as directed: Benefiber 1 tbs daily  Follow up in 6 months  Thank you,  Dr. Jackquline Denmark

## 2020-09-17 DIAGNOSIS — R1013 Epigastric pain: Secondary | ICD-10-CM | POA: Diagnosis not present

## 2020-09-30 DIAGNOSIS — G250 Essential tremor: Secondary | ICD-10-CM | POA: Diagnosis not present

## 2020-09-30 DIAGNOSIS — E1142 Type 2 diabetes mellitus with diabetic polyneuropathy: Secondary | ICD-10-CM | POA: Diagnosis not present

## 2020-09-30 DIAGNOSIS — D509 Iron deficiency anemia, unspecified: Secondary | ICD-10-CM | POA: Diagnosis not present

## 2020-09-30 DIAGNOSIS — I10 Essential (primary) hypertension: Secondary | ICD-10-CM | POA: Diagnosis not present

## 2020-09-30 DIAGNOSIS — E785 Hyperlipidemia, unspecified: Secondary | ICD-10-CM | POA: Diagnosis not present

## 2020-09-30 DIAGNOSIS — F418 Other specified anxiety disorders: Secondary | ICD-10-CM | POA: Diagnosis not present

## 2020-10-06 DIAGNOSIS — H903 Sensorineural hearing loss, bilateral: Secondary | ICD-10-CM | POA: Diagnosis not present

## 2020-10-06 DIAGNOSIS — H9190 Unspecified hearing loss, unspecified ear: Secondary | ICD-10-CM | POA: Diagnosis not present

## 2020-10-06 DIAGNOSIS — J342 Deviated nasal septum: Secondary | ICD-10-CM | POA: Diagnosis not present

## 2020-10-06 DIAGNOSIS — H9319 Tinnitus, unspecified ear: Secondary | ICD-10-CM | POA: Diagnosis not present

## 2020-10-10 ENCOUNTER — Ambulatory Visit: Payer: Medicare Other | Admitting: Gastroenterology

## 2020-10-16 DIAGNOSIS — E119 Type 2 diabetes mellitus without complications: Secondary | ICD-10-CM | POA: Diagnosis not present

## 2020-10-16 DIAGNOSIS — H40013 Open angle with borderline findings, low risk, bilateral: Secondary | ICD-10-CM | POA: Diagnosis not present

## 2020-10-29 ENCOUNTER — Ambulatory Visit (INDEPENDENT_AMBULATORY_CARE_PROVIDER_SITE_OTHER): Payer: Medicare Other | Admitting: Gastroenterology

## 2020-10-29 ENCOUNTER — Encounter: Payer: Self-pay | Admitting: Gastroenterology

## 2020-10-29 ENCOUNTER — Other Ambulatory Visit: Payer: Self-pay

## 2020-10-29 ENCOUNTER — Other Ambulatory Visit (INDEPENDENT_AMBULATORY_CARE_PROVIDER_SITE_OTHER): Payer: Medicare Other

## 2020-10-29 VITALS — BP 122/94 | HR 80 | Ht 63.0 in | Wt 172.5 lb

## 2020-10-29 DIAGNOSIS — Z87898 Personal history of other specified conditions: Secondary | ICD-10-CM | POA: Diagnosis not present

## 2020-10-29 DIAGNOSIS — Z86010 Personal history of colonic polyps: Secondary | ICD-10-CM

## 2020-10-29 DIAGNOSIS — K5909 Other constipation: Secondary | ICD-10-CM

## 2020-10-29 NOTE — Progress Notes (Signed)
Chief Complaint:   Referring Provider:  Nicoletta Dress, MD      ASSESSMENT AND PLAN;   #1. LLQ pain with Chronic constipation.   #2. H/O eso dysphagia s/p EGD with dil 17 mm savory with resolution of dysphagia (Dr. Lyda Jester) 08/19/2020. EGD- the eso was tortuous, gastric polyps (Bx-fundic gland polyps), large hiatal hernia, previous history of paraesophageal hernia repair.  #3. H/O polyps  Plan:  - Benefiber 1 TBS po qd with 8 oz water.  If still with constipation, will give her a trial of MiraLAX with Benefiber. - CT AP with p.o. and IV contrast - CBC, CMP and TSH, celiac  - Call in 12 weeks - Rpt colon 12/2021 - D/W pt and her husband.      HPI:    Rachel Mata is a 75 y.o. female  With HTN, PVD, diet-controlled DM with peripheral neuropathy, hiatal hernia, HLD Accompanied by her husband  C/O "earthworn/ribbon like stools" with decreased caliber and occasional straining. Over the last several years. BMs 1/QOD. She has LLQ abdominal discomfort which gets better with defecation. No weight loss. No melena or hematochezia.  No fever or chills.  We have gone over last colonoscopy in detail.  No further dysphagia after dilation.  She has increased water intake.  Previous GI procedures: Colonoscopy 12/31/2020 -Colonic polyps status post polypectomy. Bx-tubular adenomas -Mild sigmoid diverticulosis. Highly tortuous colon. " Fixed sigmoid" -Non-bleeding external internal hemorrhoids.  Colonoscopy November 2014: Difficult procedure, peds scope. Dr. Melina Copa  EGD with Dil 08/19/2020 Dr. 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   Wt Readings from Last 3 Encounters:  10/29/20 172 lb 8 oz (78.2 kg)  08/26/20 177 lb (80.3 kg)  01/01/19 186 lb (84.4 kg)    Past Medical History:  Diagnosis Date  . Anemia   . Colon polyps 10/17/2017  . Depression  10/17/2017  . Diabetes mellitus without complication ([place]) 7858   diet controlled  . Essential tremor 2019  . Gastritis 10/17/2017  . GERD (gastroesophageal reflux disease) 10/17/2017  . Hiatal hernia 10/17/2017  . Hyperlipidemia   . Hypertension   . Osteoarthritis 10/17/2017  . Peripheral neuropathy 10/17/2017  . Peripheral vascular disease ([place])   . PVD (peripheral vascular disease) ([place]) 10/17/2017  . Septic olecranon bursitis 10/17/2017  . Skin cancer 10/17/2017  . Sleep apnea 2010   does not use cpap  . Sleep apnea, obstructive 10/17/2017  . Stress incontinence of urine 08/09/2017  . Subacute vulvitis 08/09/2017  . Syncope 10/17/2017    Past Surgical History:  Procedure Laterality Date  . ABDOMINAL HYSTERECTOMY    . BLADDER SURGERY    . CHOLECYSTECTOMY    . COLONOSCOPY  05/28/2013   No polyps found. There was a spasm and muscular atrophy in the sigmoid colon. The cecum could not be identified from the distance but was not intubated. The patient had an increased pain during the procedure and had mild vagal reaction.   . ESOPHAGOGASTRODUODENOSCOPY  04/28/2012   Esophageal spasm. Bile reflux gastritis. Medium hiatal hernia.   Marland Kitchen PARAESOPHAGEAL HERNIA REPAIR    . RECTOCELE REPAIR    . UPPER GASTROINTESTINAL ENDOSCOPY    . UPPER GASTROINTESTINAL ENDOSCOPY  08/19/2020   Dr Meissenheimer    Family History  Problem Relation Age of Onset  . Emphysema Mother   . Asthma Mother   . COPD Mother   . Prostate cancer Father   .  Heart attack Father   . Heart disease Father   . Hypercholesterolemia Sister   . Colon polyps Sister   . Heart attack Maternal Grandmother   . Heart attack Maternal Grandfather   . Diabetes Paternal Grandmother   . Diabetes Paternal Grandfather   . Colon cancer Neg Hx   . Esophageal cancer Neg Hx   . Stomach cancer Neg Hx   . Rectal cancer Neg Hx     Social History   Tobacco Use  . Smoking status: Never Smoker  . Smokeless tobacco: Never Used  Vaping Use  . Vaping  Use: Never used  Substance Use Topics  . Alcohol use: Yes    Comment: occasional wine  . Drug use: Never    Current Outpatient Medications  Medication Sig Dispense Refill  . amLODipine (NORVASC) 5 MG tablet Take 5 mg by mouth daily.    Marland Kitchen b complex vitamins capsule Take 1 capsule by mouth daily.    Marland Kitchen esomeprazole (NEXIUM) 40 MG capsule Take 40 mg by mouth daily at 12 noon.    . fesoterodine (TOVIAZ) 4 MG TB24 tablet Take 4 mg by mouth daily.    Marland Kitchen losartan (COZAAR) 100 MG tablet Take 100 mg by mouth daily.    . propranolol (INDERAL) 10 MG tablet Take 10 mg by mouth 3 (three) times daily.  3  . simvastatin (ZOCOR) 20 MG tablet Take 20 mg by mouth daily.    Marland Kitchen venlafaxine XR (EFFEXOR-XR) 150 MG 24 hr capsule Take 150 mg by mouth daily with breakfast.    . VITAMIN D PO Take 1 tablet by mouth daily at 6 (six) AM.     No current facility-administered medications for this visit.    Allergies  Allergen Reactions  . Penicillin G Other (See Comments)    Pt states it doesn't work    Review of Systems:  neg     Physical Exam:    BP (!) 122/94 (BP Location: Left Arm, Patient Position: Sitting, Cuff Size: Normal)   Pulse 80   Ht 5\' 3"  (1.6 m)   Wt 172 lb 8 oz (78.2 kg)   BMI 30.56 kg/m  Filed Weights   10/29/20 1412  Weight: 172 lb 8 oz (78.2 kg)   Constitutional:  Well-developed, in no acute distress. Psychiatric: Normal mood and affect. Behavior is normal. HEENT: Pupils normal.  Conjunctivae are normal. No scleral icterus. Abdominal: Soft, nondistended. Nontender. Bowel sounds active throughout. There are no masses palpable. No hepatomegaly. Rectal:  defered Neurological: Alert and oriented to person place and time. Skin: Skin is warm and dry. No rashes noted.   [name], MD 10/29/2020, 2:38 PM  Cc: Nicoletta Dress, MD

## 2020-10-29 NOTE — Patient Instructions (Addendum)
If you are age 75 or older, your body mass index should be between 23-30. Your Body mass index is 30.56 kg/m. If this is out of the aforementioned range listed, please consider follow up with your Primary Care Provider.  If you are age 16 or younger, your body mass index should be between 19-25. Your Body mass index is 30.56 kg/m. If this is out of the aformentioned range listed, please consider follow up with your Primary Care Provider.   Please go to the lab on the 2nd floor [address] before you leave the office today.   You have been scheduled for a CT [facility] the abdomen [facility] pelvis [facility] [place] [place] [address] [address] [facility]   You are scheduled on    at . You should arrive 15 minutes prior to your appointment time for registration. Please follow the written instructions below on the day of your exam:  WARNING: IF YOU ARE ALLERGIC TO IODINE/X-RAY DYE, PLEASE NOTIFY RADIOLOGY IMMEDIATELY AT [phone]! YOU WILL BE GIVEN A 13 HOUR PREMEDICATION PREP.  1) Do not eat or drink anything after       (4 hours prior to your test) 2) You have been given 2 bottles of oral contrast to drink. The solution may taste better if refrigerated, but do NOT add ice or any other liquid to this solution. Shake well before drinking.    Drink 1 bottle of contrast @        (2 hours prior to your exam)  Drink 1 bottle of contrast @        (1 hour prior to your exam)  You may take any medications as prescribed with a small amount of water, if necessary. If you take any of the following medications: METFORMIN, GLUCOPHAGE, GLUCOVANCE, AVANDAMET, RIOMET, FORTAMET, [place] MET, JANUMET, GLUMETZA or METAGLIP, you MAY be asked to HOLD this medication 48 hours AFTER the exam.  The purpose of you drinking the oral contrast is to aid in the visualization of your intestinal tract. The contrast solution may cause some diarrhea. Depending on your individual set of symptoms, you  may also receive an intravenous injection of x-ray contrast/dye. Plan on being at [facility] for 30 minutes or longer, depending on the type of exam you are having performed.  This test typically takes 30-45 minutes to complete.  If you have any questions regarding your exam or if you need to reschedule, you may call the CT department at [phone] between the hours of 8:00 am and 5:00 pm, Monday-Friday.  ________________________________________________________________________  Please purchase the following medications over the counter and take as directed: Benefiber 1 tablespoon daily in 8 oz of water  Repeat colon 12-2021 we will send a letter out to remind you.  Call in 12 weeks to let us know how you are doing.  Thank you,  Dr. Jackquline Denmark

## 2020-10-31 LAB — CELIAC PANEL 10
Antigliadin Abs, IgA: 7 units (ref 0–19)
Endomysial IgA: NEGATIVE
Gliadin IgG: 3 units (ref 0–19)
IgA/Immunoglobulin A, Serum: 164 mg/dL (ref 64–422)
Tissue Transglut Ab: <2 U/mL (ref 0–5)
Transglutaminase IgA: <2 U/mL (ref 0–3)

## 2020-10-31 LAB — CBC WITH DIFFERENTIAL/PLATELET
Basophils Absolute: 0 10*3/uL (ref 0.0–0.2)
Basos: 0 %
EOS (ABSOLUTE): 0 10*3/uL (ref 0.0–0.4)
Eos: 0 %
Hematocrit: 44 % (ref 34.0–46.6)
Hemoglobin: 14.8 g/dL (ref 11.1–15.9)
Immature Grans (Abs): 0 10*3/uL (ref 0.0–0.1)
Immature Granulocytes: 0 %
Lymphocytes Absolute: 2.5 10*3/uL (ref 0.7–3.1)
Lymphs: 33 %
MCH: 30.5 pg (ref 26.6–33.0)
MCHC: 33.6 g/dL (ref 31.5–35.7)
MCV: 91 fL (ref 79–97)
Monocytes Absolute: 0.6 10*3/uL (ref 0.1–0.9)
Monocytes: 7 %
Neutrophils Absolute: 4.5 10*3/uL (ref 1.4–7.0)
Neutrophils: 60 %
Platelets: 255 10*3/uL (ref 150–450)
RBC: 4.85 x10E6/uL (ref 3.77–5.28)
RDW: 12.7 % (ref 11.7–15.4)
WBC: 7.5 10*3/uL (ref 3.4–10.8)

## 2020-10-31 LAB — COMPREHENSIVE METABOLIC PANEL
ALT: 25 IU/L (ref 0–32)
AST: 29 IU/L (ref 0–40)
Albumin/Globulin Ratio: 2 (ref 1.2–2.2)
Albumin: 4.9 g/dL — ABNORMAL HIGH (ref 3.7–4.7)
Alkaline Phosphatase: 76 IU/L (ref 44–121)
BUN/Creatinine Ratio: 25 (ref 12–28)
BUN: 26 mg/dL (ref 8–27)
Bilirubin Total: 0.6 mg/dL (ref 0.0–1.2)
CO2: 24 mmol/L (ref 20–29)
Calcium: 11.5 mg/dL — ABNORMAL HIGH (ref 8.7–10.3)
Chloride: 98 mmol/L (ref 96–106)
Creatinine, Ser: 1.03 mg/dL — ABNORMAL HIGH (ref 0.57–1.00)
Globulin, Total: 2.5 g/dL (ref 1.5–4.5)
Glucose: 105 mg/dL — ABNORMAL HIGH (ref 65–99)
Potassium: 3.9 mmol/L (ref 3.5–5.2)
Sodium: 139 mmol/L (ref 134–144)
Total Protein: 7.4 g/dL (ref 6.0–8.5)
eGFR: 57 mL/min/{1.73_m2} — ABNORMAL LOW (ref >59)

## 2020-10-31 LAB — TSH: TSH: 0.9 u[IU]/mL (ref 0.450–4.500)

## 2020-10-31 NOTE — Progress Notes (Signed)
Please inform the patient. All results normal or at baseline except for elevated calcium. She needs to get in touch with Dr. Delena Bali regarding that Send report to Dr. Delena Bali and pl call his nurse to confirm that that have gotten the blood tests

## 2020-11-05 ENCOUNTER — Ambulatory Visit (HOSPITAL_BASED_OUTPATIENT_CLINIC_OR_DEPARTMENT_OTHER): Payer: Medicare Other

## 2020-11-05 ENCOUNTER — Other Ambulatory Visit: Payer: Self-pay

## 2020-11-05 ENCOUNTER — Ambulatory Visit (HOSPITAL_BASED_OUTPATIENT_CLINIC_OR_DEPARTMENT_OTHER)
Admission: RE | Admit: 2020-11-05 | Discharge: 2020-11-05 | Disposition: A | Discharge status: Home / Self Care | Payer: Medicare Other | Source: Ambulatory Visit | Attending: Gastroenterology | Admitting: Gastroenterology

## 2020-11-05 DIAGNOSIS — Z87898 Personal history of other specified conditions: Secondary | ICD-10-CM

## 2020-11-05 DIAGNOSIS — Z860100 Personal history of colon polyps, unspecified: Secondary | ICD-10-CM

## 2020-11-05 DIAGNOSIS — K449 Diaphragmatic hernia without obstruction or gangrene: Secondary | ICD-10-CM | POA: Diagnosis not present

## 2020-11-05 DIAGNOSIS — K5909 Other constipation: Secondary | ICD-10-CM | POA: Diagnosis not present

## 2020-11-05 DIAGNOSIS — N281 Cyst of kidney, acquired: Secondary | ICD-10-CM | POA: Diagnosis not present

## 2020-11-05 DIAGNOSIS — K4090 Unilateral inguinal hernia, without obstruction or gangrene, not specified as recurrent: Secondary | ICD-10-CM | POA: Diagnosis not present

## 2020-11-05 DIAGNOSIS — I700 Atherosclerosis of aorta: Secondary | ICD-10-CM | POA: Diagnosis not present

## 2020-11-05 MED ORDER — IOHEXOL 300 MG/ML  SOLN
100.0000 mL | Freq: Once | INTRAMUSCULAR | Status: AC | PRN
  Administered 2020-11-05: 100 mL via INTRAVENOUS

## 2020-11-06 NOTE — Progress Notes (Signed)
Please inform the patient. CT -no acute abnormalities Has a large hiatal hernia Must continue Nexium Send report to family physician

## 2020-11-30 DIAGNOSIS — I2699 Other pulmonary embolism without acute cor pulmonale: Secondary | ICD-10-CM | POA: Diagnosis not present

## 2020-11-30 DIAGNOSIS — K449 Diaphragmatic hernia without obstruction or gangrene: Secondary | ICD-10-CM | POA: Diagnosis not present

## 2020-11-30 DIAGNOSIS — R0602 Shortness of breath: Secondary | ICD-10-CM | POA: Diagnosis not present

## 2020-11-30 DIAGNOSIS — E041 Nontoxic single thyroid nodule: Secondary | ICD-10-CM | POA: Diagnosis not present

## 2020-11-30 DIAGNOSIS — Z79899 Other long term (current) drug therapy: Secondary | ICD-10-CM | POA: Diagnosis not present

## 2020-11-30 DIAGNOSIS — R0600 Dyspnea, unspecified: Secondary | ICD-10-CM | POA: Diagnosis not present

## 2020-11-30 DIAGNOSIS — Z20822 Contact with and (suspected) exposure to covid-19: Secondary | ICD-10-CM | POA: Diagnosis not present

## 2020-11-30 DIAGNOSIS — E860 Dehydration: Secondary | ICD-10-CM | POA: Diagnosis not present

## 2020-12-05 DIAGNOSIS — H9319 Tinnitus, unspecified ear: Secondary | ICD-10-CM | POA: Diagnosis not present

## 2020-12-05 DIAGNOSIS — R490 Dysphonia: Secondary | ICD-10-CM | POA: Diagnosis not present

## 2020-12-05 DIAGNOSIS — J387 Other diseases of larynx: Secondary | ICD-10-CM | POA: Diagnosis not present

## 2020-12-05 DIAGNOSIS — E041 Nontoxic single thyroid nodule: Secondary | ICD-10-CM | POA: Diagnosis not present

## 2020-12-05 DIAGNOSIS — H9190 Unspecified hearing loss, unspecified ear: Secondary | ICD-10-CM | POA: Diagnosis not present

## 2020-12-05 DIAGNOSIS — J342 Deviated nasal septum: Secondary | ICD-10-CM | POA: Diagnosis not present

## 2020-12-23 DIAGNOSIS — E042 Nontoxic multinodular goiter: Secondary | ICD-10-CM | POA: Diagnosis not present

## 2020-12-23 DIAGNOSIS — E041 Nontoxic single thyroid nodule: Secondary | ICD-10-CM | POA: Diagnosis not present

## 2020-12-25 DIAGNOSIS — E042 Nontoxic multinodular goiter: Secondary | ICD-10-CM | POA: Diagnosis not present

## 2021-01-21 DIAGNOSIS — H40013 Open angle with borderline findings, low risk, bilateral: Secondary | ICD-10-CM | POA: Diagnosis not present

## 2021-01-23 DIAGNOSIS — D649 Anemia, unspecified: Secondary | ICD-10-CM | POA: Insufficient documentation

## 2021-01-23 DIAGNOSIS — Z20822 Contact with and (suspected) exposure to covid-19: Secondary | ICD-10-CM | POA: Diagnosis not present

## 2021-01-26 ENCOUNTER — Other Ambulatory Visit: Payer: Self-pay

## 2021-01-26 DIAGNOSIS — E041 Nontoxic single thyroid nodule: Secondary | ICD-10-CM

## 2021-01-26 DIAGNOSIS — R0602 Shortness of breath: Secondary | ICD-10-CM

## 2021-01-26 DIAGNOSIS — N816 Rectocele: Secondary | ICD-10-CM

## 2021-01-26 DIAGNOSIS — M71122 Other infective bursitis, left elbow: Secondary | ICD-10-CM | POA: Insufficient documentation

## 2021-01-26 DIAGNOSIS — E860 Dehydration: Secondary | ICD-10-CM

## 2021-01-26 DIAGNOSIS — N179 Acute kidney failure, unspecified: Secondary | ICD-10-CM

## 2021-01-26 DIAGNOSIS — E785 Hyperlipidemia, unspecified: Secondary | ICD-10-CM | POA: Insufficient documentation

## 2021-01-26 DIAGNOSIS — K449 Diaphragmatic hernia without obstruction or gangrene: Secondary | ICD-10-CM

## 2021-01-26 DIAGNOSIS — R079 Chest pain, unspecified: Secondary | ICD-10-CM

## 2021-01-26 DIAGNOSIS — I739 Peripheral vascular disease, unspecified: Secondary | ICD-10-CM | POA: Insufficient documentation

## 2021-01-26 HISTORY — DX: Acute kidney failure, unspecified: N17.9

## 2021-01-26 HISTORY — DX: Shortness of breath: R06.02

## 2021-01-26 HISTORY — DX: Other infective bursitis, left elbow: M71.122

## 2021-01-26 HISTORY — DX: Hypercalcemia: E83.52

## 2021-01-26 HISTORY — DX: Chest pain, unspecified: R07.9

## 2021-01-26 HISTORY — DX: Rectocele: N81.6

## 2021-01-26 HISTORY — DX: Nontoxic single thyroid nodule: E04.1

## 2021-01-26 HISTORY — DX: Dehydration: E86.0

## 2021-01-26 HISTORY — DX: Hyperlipidemia, unspecified: E78.5

## 2021-01-26 HISTORY — DX: Diaphragmatic hernia without obstruction or gangrene: K44.9

## 2021-01-26 NOTE — Progress Notes (Signed)
Cardiology Office Note:    Date:  01/27/2021   ID:  Rachel Mata, DOB [birth year], MRN [id number]  PCP:  Nicoletta Dress, MD  Cardiologist:  Shirlee More, MD    Referring MD: Nicoletta Dress, MD    ASSESSMENT:    1. Chest pain of uncertain etiology   2. Primary hypertension   3. Hyperlipidemia, unspecified hyperlipidemia type    PLAN:    In order of problems listed above:  Symptoms quite concerning for new onset unstable angina.  She initiated daily aspirin given a prescription for nitroglycerin and continue her beta-blocker and statin.  After discussion of modalities especially large hiatal hernia cardiac CTA is appropriate she has no dye allergy and will be scheduled in facilitated fashion.  If high risk markers we need to consider coronary angiography. At target continue treatment including thiazide diuretic ARB calcium channel blocker and she is also on propranolol Stable hyperlipidemia continue her statin   Next appointment: 6 weeks    Medication Adjustments/Labs and Tests Ordered: Current medicines are reviewed at length with the patient today.  Concerns regarding medicines are outlined above.  No orders of the defined types were placed in this encounter.  No orders of the defined types were placed in this encounter.   Chief Complaint  Patient presents with   Shortness of Breath     History of Present Illness:    Rachel Mata is a 75 y.o. female with a hx of hypertension type 2 diabetes mellitus and hyperlipidemia last seen by me 10/25/2017 after an episode of syncope.  A 1 month cardiac monitor at that time showed no bradycardia and no episodes of heart block she had symptomatic events with shortness of breath lightheadedness and fatigue unassociated with arrhythmia.  An echocardiogram [facility] in 2019 normal left ventricular size function filling pressures normal right ventricular function and pulmonary artery pressures and trace aortic  regurgitation.  Compliance with diet, lifestyle and medications: Yes  Twice in the last week she is waking during the night with severe substernal chest pain that radiates into her jaw associated shortness of breath lasting 10 to 15 minutes resolving spontaneously.  She has never had this anginal discomfort before it does not happen during the day there is no associated palpitation it was not pleuritic no associated GI symptoms and no obvious precipitant, she did not have a bad dream.  She does have obstructive sleep apnea and is CPAP intolerant.  She was seen in [place] ED with the abrupt onset of shortness of breath 11/30/2020.  Respiratory rate 36/min heart rate 85 bpm blood pressure 112/46 and oxygen sat 100%.  CBC was normal hemoglobin 14.4 creatinine 0.80 potassium 4.3 chest x-ray was read as normal CTA of the chest showed no findings of pulmonary embolism there is a moderate hiatal hernia and a large nodules were noted in the left lobe of the thyroid.  She was discharged from the emergency room with a diagnosis of shortness of breath and was advised to follow-up with her primary care physician.  I independently reviewed her EKG which showed artifact low voltage nonspecific ST-T changes.  Chart review shows a recent TSH normal range 0.903 months ago.  She did have hypercalcemia calcium was 11.5 at that time. She has been seen by GI who is aware that she has a large hiatal hernia. Past Medical History:  Diagnosis Date   Acute kidney injury ([place]) 01/26/2021   Anemia    Chest pain 01/26/2021   Colon polyps  10/17/2017   Depression 10/17/2017   Diabetes mellitus without complication ([place]) 1941   diet controlled   Dyslipidemia 01/26/2021   Edema of both legs 10/20/2017   Essential tremor 2019   Gastritis 10/17/2017   GERD (gastroesophageal reflux disease) 10/17/2017   Hiatal hernia 10/17/2017   Hypercalcemia 01/26/2021   Hyperlipidemia    Hypertension    Insulin pump in place 10/25/2017   Leg swelling  09/04/2018   Luetscher's syndrome 01/26/2021   Numbness in feet 10/20/2017   Osteoarthritis 10/17/2017   Paraesophageal hernia 01/26/2021   Peripheral neuropathy 10/17/2017   Peripheral vascular disease ([place])    PVD (peripheral vascular disease) ([place]) 10/17/2017   Rectocele 01/26/2021   Septic olecranon bursitis 10/17/2017   Septic olecranon bursitis of left elbow 01/26/2021   Shortness of breath 01/26/2021   Skin cancer 10/17/2017   Sleep apnea 2010   does not use cpap   Sleep apnea, obstructive 10/17/2017   Stress incontinence of urine 08/09/2017   Subacute vulvitis 08/09/2017   Syncope 10/17/2017   Thyroid nodule incidentally noted on imaging study 01/26/2021   Type 2 diabetes mellitus with peripheral neuropathy ([place]) 10/25/2017    Past Surgical History:  Procedure Laterality Date   ABDOMINAL HYSTERECTOMY     BLADDER SURGERY     CHOLECYSTECTOMY     COLONOSCOPY  05/28/2013   No polyps found. There was a spasm and muscular atrophy in the sigmoid colon. The cecum could not be identified from the distance but was not intubated. The patient had an increased pain during the procedure and had mild vagal reaction.    ESOPHAGOGASTRODUODENOSCOPY  04/28/2012   Esophageal spasm. Bile reflux gastritis. Medium hiatal hernia.    PARAESOPHAGEAL HERNIA REPAIR     RECTOCELE REPAIR     UPPER GASTROINTESTINAL ENDOSCOPY     UPPER GASTROINTESTINAL ENDOSCOPY  08/19/2020   Dr Signe Colt    Current Medications: Current Meds  Medication Sig   amLODipine (NORVASC) 5 MG tablet Take 5 mg by mouth daily.   b complex vitamins capsule Take 1 capsule by mouth daily.   esomeprazole (NEXIUM) 40 MG capsule Take 40 mg by mouth daily at 12 noon.   fesoterodine (TOVIAZ) 4 MG TB24 tablet Take 4 mg by mouth daily.   losartan-hydrochlorothiazide (HYZAAR) 100-25 MG tablet Take 1 tablet by mouth daily.   propranolol (INDERAL) 10 MG tablet Take 10 mg by mouth 3 (three) times daily.   simvastatin (ZOCOR) 20 MG tablet Take 20 mg by  mouth daily.   venlafaxine XR (EFFEXOR-XR) 150 MG 24 hr capsule Take 150 mg by mouth daily with breakfast.   VITAMIN D PO Take 1 tablet by mouth daily at 6 (six) AM.     Allergies:   Penicillin g   Social History   Socioeconomic History   Marital status: Married    Spouse name: Not on file   Number of children: Not on file   Years of education: Not on file   Highest education level: Not on file  Occupational History   Not on file  Tobacco Use   Smoking status: Never   Smokeless tobacco: Never  Vaping Use   Vaping Use: Never used  Substance and Sexual Activity   Alcohol use: Yes    Comment: occasional wine   Drug use: Never   Sexual activity: Not on file  Other Topics Concern   Not on file  Social History Narrative   Not on file   Social Determinants of Health  Financial Resource Strain: Not on file  Food Insecurity: Not on file  Transportation Needs: Not on file  Physical Activity: Not on file  Stress: Not on file  Social Connections: Not on file     Family History: The patient's family history includes Asthma in her mother; COPD in her mother; Colon polyps in her sister; Diabetes in her paternal grandfather and paternal grandmother; Emphysema in her mother; Heart attack in her father, maternal grandfather, and maternal grandmother; Heart disease in her father; Hypercholesterolemia in her sister; Prostate cancer in her father. There is no history of Colon cancer, Esophageal cancer, Stomach cancer, or Rectal cancer. ROS:   Please see the history of present illness.    All other systems reviewed and are negative.  EKGs/Labs/Other Studies Reviewed:    The following studies were reviewed today:  EKG:  EKG ordered today and personally reviewed.  The ekg ordered today demonstrates a better quality EKG in the emergency room she has a low voltage possible inferior lateral MI  Recent Labs: 10/29/2020: ALT 25; BUN 26; Creatinine, Ser 1.03; Hemoglobin 14.8; Platelets 255;  Potassium 3.9; Sodium 139; TSH 0.900  Recent Lipid Panel 09/30/2020 cholesterol 150 LDL 66 triglycerides 183 HDL 54  Physical Exam:    VS:  BP 110/70 (BP Location: Right Arm, Patient Position: Sitting)   Pulse 86   Ht 5\' 3"  (1.6 m)   Wt 175 lb (79.4 kg)   SpO2 96%   BMI 31.00 kg/m     Wt Readings from Last 3 Encounters:  01/27/21 175 lb (79.4 kg)  10/29/20 172 lb 8 oz (78.2 kg)  08/26/20 177 lb (80.3 kg)     GEN:  Well nourished, well developed in no acute distress HEENT: Normal NECK: No JVD; No carotid bruits LYMPHATICS: No lymphadenopathy CARDIAC: RRR, no murmurs, rubs, gallops RESPIRATORY:  Clear to auscultation without rales, wheezing or rhonchi  ABDOMEN: Soft, non-tender, non-distended MUSCULOSKELETAL:  No edema; No deformity  SKIN: Warm and dry NEUROLOGIC:  Alert and oriented x 3 PSYCHIATRIC:  Normal affect    Signed, Shirlee More, MD  01/27/2021 8:22 AM    [facility]

## 2021-01-27 ENCOUNTER — Other Ambulatory Visit: Payer: Self-pay

## 2021-01-27 ENCOUNTER — Ambulatory Visit (INDEPENDENT_AMBULATORY_CARE_PROVIDER_SITE_OTHER): Payer: Medicare Other | Admitting: Cardiology

## 2021-01-27 ENCOUNTER — Encounter: Payer: Self-pay | Admitting: Cardiology

## 2021-01-27 VITALS — BP 110/70 | HR 86 | Ht 63.0 in | Wt 175.0 lb

## 2021-01-27 DIAGNOSIS — R079 Chest pain, unspecified: Secondary | ICD-10-CM

## 2021-01-27 DIAGNOSIS — R072 Precordial pain: Secondary | ICD-10-CM

## 2021-01-27 DIAGNOSIS — R0602 Shortness of breath: Secondary | ICD-10-CM | POA: Diagnosis not present

## 2021-01-27 DIAGNOSIS — E785 Hyperlipidemia, unspecified: Secondary | ICD-10-CM

## 2021-01-27 DIAGNOSIS — I739 Peripheral vascular disease, unspecified: Secondary | ICD-10-CM

## 2021-01-27 DIAGNOSIS — I10 Essential (primary) hypertension: Secondary | ICD-10-CM | POA: Diagnosis not present

## 2021-01-27 DIAGNOSIS — E1142 Type 2 diabetes mellitus with diabetic polyneuropathy: Secondary | ICD-10-CM

## 2021-01-27 MED ORDER — METOPROLOL TARTRATE 50 MG PO TABS: ORAL_TABLET | ORAL | 0 refills | Status: DC

## 2021-01-27 MED ORDER — ASPIRIN EC 81 MG PO TBEC: 81.0000 mg | DELAYED_RELEASE_TABLET | Freq: Every day | ORAL | 3 refills | Status: DC

## 2021-01-27 MED ORDER — NITROGLYCERIN 0.4 MG SL SUBL: 0.4000 mg | SUBLINGUAL_TABLET | SUBLINGUAL | 6 refills | Status: DC | PRN

## 2021-01-27 NOTE — Patient Instructions (Addendum)
Medication Instructions:  Your physician has recommended you make the following change in your medication:   Take 81 mg coated aspirin daily.  Use nitroglycerin 1 tablet placed under the tongue at the first sign of chest pain or an angina attack. 1 tablet may be used every 5 minutes as needed, for up to 15 minutes. Do not take more than 3 tablets in 15 minutes. If pain persist call 911 or go to the nearest ED.   *If you need a refill on your cardiac medications before your next appointment, please call your pharmacy*   Lab Work: Your physician recommends that you return for lab work in: 1 week before your CT. No appointment needed or you don not need to fast.   If you have labs (blood work) drawn today and your tests are completely normal, you will receive your results only by: [place] (if you have MyChart) OR A paper copy in the mail If you have any lab test that is abnormal or we need to change your treatment, we will call you to review the results.   Testing/Procedures: Your cardiac CT will be scheduled at:   [facility], [place] [address] [phone]   If scheduled at [facility], please arrive at the [facility] main entrance of [facility] 30 minutes prior to test start time. Proceed to the [facility] Radiology Department (first floor) to check-in and test prep.  Please follow these instructions carefully (unless otherwise directed):    On the Night Before the Test: Be sure to Drink plenty of water. Do not consume any caffeinated/decaffeinated beverages or chocolate 12 hours prior to your test. Do not take any antihistamines 12 hours prior to your test.   On the Day of the Test: Drink plenty of water. Do not drink any water within one hour of the test. Do not eat any food 4 hours prior to the test. You may take your regular medications prior to the test.  Take metoprolol (Lopressor) two hours prior to  test. FEMALES- please wear underwire-free bra if available       After the Test: Drink plenty of water. After receiving IV contrast, you may experience a mild flushed feeling. This is normal. On occasion, you may experience a mild rash up to 24 hours after the test. This is not dangerous. If this occurs, you can take Benadryl 25 mg and increase your fluid intake. If you experience trouble breathing, this can be serious. If it is severe call 911 IMMEDIATELY. If it is mild, please call our office.    Once we have confirmed authorization from your insurance company, we will call you to set up a date and time for your test. Based on how quickly your insurance processes prior authorizations requests, please allow up to 4 weeks to be contacted for scheduling your Cardiac CT appointment. Be advised that routine Cardiac CT appointments could be scheduled as many as 8 weeks after your provider has ordered it.  For non-scheduling related questions, please contact the cardiac imaging nurse navigator should you have any questions/concerns: Marchia Bond, Cardiac Imaging Nurse Navigator Burley Saver, Interim Cardiac Imaging Nurse [place] [facility] Direct Office Dial: [phone]   For scheduling needs, including cancellations and rescheduling, please call Vivien Rota at [phone].     Follow-Up: At [facility], you and your health needs are our priority.  As part of our continuing mission to provide you with  exceptional heart care, we have created designated Provider Care Teams.  These Care Teams include your primary Cardiologist (physician) and Advanced Practice Providers (APPs -  Physician Assistants and Nurse Practitioners) who all work together to provide you with the care you need, when you need it.  We recommend signing up for the patient portal called "MyChart".  Sign up information is provided on this After Visit Summary.  MyChart is used to connect with patients for  Virtual Visits (Telemedicine).  Patients are able to view lab/test results, encounter notes, upcoming appointments, etc.  Non-urgent messages can be sent to your provider as well.   To learn more about what you can do with MyChart, go to [email or web address].    Your next appointment:   6 week(s)  The format for your next appointment:   In Person  Provider:   Shirlee More, MD   Other Instructions Cardiac CT Angiogram A cardiac CT angiogram is a procedure to look at the heart and the area around the heart. It may be done to help find the cause of chest pains or other symptoms of heart disease. During this procedure, a substance called contrast dye is injected into the blood vessels in the area to be checked. A large X-ray machine, called a CT scanner, then takes detailed pictures of the heart and the surrounding area. The procedure is also sometimes called a coronary CT angiogram, coronary artery scanning, or CTA. A cardiac CT angiogram allows the health care provider to see how well blood is flowing to and from the heart. The health care provider will be able to see if there are any problems, such as: Blockage or narrowing of the coronary arteries in the heart. Fluid around the heart. Signs of weakness or disease in the muscles, valves, and tissues of the heart. Tell a health care provider about: Any allergies you have. This is especially important if you have had a previous allergic reaction to contrast dye. All medicines you are taking, including vitamins, herbs, eye drops, creams, and over-the-counter medicines. Any blood disorders you have. Any surgeries you have had. Any medical conditions you have. Whether you are pregnant or may be pregnant. Any anxiety disorders, chronic pain, or other conditions you have that may increase your stress or prevent you from lying still. What are the risks? Generally, this is a safe procedure. However, problems may occur,  including: Bleeding. Infection. Allergic reactions to medicines or dyes. Damage to other structures or organs. Kidney damage from the contrast dye that is used. Increased risk of cancer from radiation exposure. This risk is low. Talk with your health care provider about: The risks and benefits of testing. How you can receive the lowest dose of radiation. What happens before the procedure? Wear comfortable clothing and remove any jewelry, glasses, dentures, and hearing aids. Follow instructions from your health care provider about eating and drinking. This may include: For 12 hours before the procedure -- avoid caffeine. This includes tea, coffee, soda, energy drinks, and diet pills. Drink plenty of water or other fluids that do not have caffeine in them. Being well hydrated can prevent complications. For 4-6 hours before the procedure -- stop eating and drinking. The contrast dye can cause nausea, but this is less likely if your stomach is empty. Ask your health care provider about changing or stopping your regular medicines. This is especially important if you are taking diabetes medicines, blood thinners, or medicines to treat problems with erections (erectile dysfunction). What happens during the  procedure?  Hair on your chest may need to be removed so that small sticky patches called electrodes can be placed on your chest. These will transmit information that helps to monitor your heart during the procedure. An IV will be inserted into one of your veins. You might be given a medicine to control your heart rate during the procedure. This will help to ensure that good images are obtained. You will be asked to lie on an exam table. This table will slide in and out of the CT machine during the procedure. Contrast dye will be injected into the IV. You might feel warm, or you may get a metallic taste in your mouth. You will be given a medicine called nitroglycerin. This will relax or dilate the  arteries in your heart. The table that you are lying on will move into the CT machine tunnel for the scan. The person running the machine will give you instructions while the scans are being done. You may be asked to: Keep your arms above your head. Hold your breath. Stay very still, even if the table is moving. When the scanning is complete, you will be moved out of the machine. The IV will be removed. The procedure may vary among health care providers and hospitals. What can I expect after the procedure? After your procedure, it is common to have: A metallic taste in your mouth from the contrast dye. A feeling of warmth. A headache from the nitroglycerin. Follow these instructions at home: Take over-the-counter and prescription medicines only as told by your health care provider. If you are told, drink enough fluid to keep your urine pale yellow. This will help to flush the contrast dye out of your body. Most people can return to their normal activities right after the procedure. Ask your health care provider what activities are safe for you. It is up to you to get the results of your procedure. Ask your health care provider, or the department that is doing the procedure, when your results will be ready. Keep all follow-up visits as told by your health care provider. This is important. Contact a health care provider if: You have any symptoms of allergy to the contrast dye. These include: Shortness of breath. Rash or hives. A racing heartbeat. Summary A cardiac CT angiogram is a procedure to look at the heart and the area around the heart. It may be done to help find the cause of chest pains or other symptoms of heart disease. During this procedure, a large X-ray machine, called a CT scanner, takes detailed pictures of the heart and the surrounding area after a contrast dye has been injected into blood vessels in the area. Ask your health care provider about changing or stopping your  regular medicines before the procedure. This is especially important if you are taking diabetes medicines, blood thinners, or medicines to treat erectile dysfunction. If you are told, drink enough fluid to keep your urine pale yellow. This will help to flush the contrast dye out of your body. This information is not intended to replace advice given to you by your health care provider. Make sure you discuss any questions you have with your health care provider. Document Revised: 02/28/2019 Document Reviewed: 02/28/2019 Elsevier Patient Education  [place].

## 2021-01-30 DIAGNOSIS — Z1231 Encounter for screening mammogram for malignant neoplasm of breast: Secondary | ICD-10-CM | POA: Diagnosis not present

## 2021-01-30 DIAGNOSIS — M8589 Other specified disorders of bone density and structure, multiple sites: Secondary | ICD-10-CM | POA: Diagnosis not present

## 2021-01-30 DIAGNOSIS — N959 Unspecified menopausal and perimenopausal disorder: Secondary | ICD-10-CM | POA: Diagnosis not present

## 2021-02-02 DIAGNOSIS — R079 Chest pain, unspecified: Secondary | ICD-10-CM | POA: Diagnosis not present

## 2021-02-03 LAB — BASIC METABOLIC PANEL
BUN/Creatinine Ratio: 24 (ref 12–28)
BUN: 23 mg/dL (ref 8–27)
CO2: 22 mmol/L (ref 20–29)
Calcium: 10.1 mg/dL (ref 8.7–10.3)
Chloride: 102 mmol/L (ref 96–106)
Creatinine, Ser: 0.94 mg/dL (ref 0.57–1.00)
Glucose: 221 mg/dL — ABNORMAL HIGH (ref 65–99)
Potassium: 3.8 mmol/L (ref 3.5–5.2)
Sodium: 141 mmol/L (ref 134–144)
eGFR: 63 mL/min/{1.73_m2} (ref >59)

## 2021-02-05 ENCOUNTER — Telehealth (HOSPITAL_COMMUNITY): Payer: Self-pay | Admitting: Emergency Medicine

## 2021-02-05 NOTE — Telephone Encounter (Signed)
Reaching out to patient to offer assistance regarding upcoming cardiac imaging study; pt verbalizes understanding of appt date/time, parking situation and where to check in, pre-test NPO status and medications ordered, and verified current allergies; name and call back number provided for further questions should they arise Marchia Bond RN Navigator Cardiac Imaging [facility] [phone] office [phone] cell  Denies claustro Denies IV issues '50mg'$  metoprolol tartrate

## 2021-02-06 ENCOUNTER — Ambulatory Visit (HOSPITAL_COMMUNITY)
Admission: RE | Admit: 2021-02-06 | Discharge: 2021-02-06 | Disposition: A | Discharge status: Home / Self Care | Payer: Medicare Other | Source: Ambulatory Visit | Attending: Cardiology | Admitting: Cardiology

## 2021-02-06 ENCOUNTER — Other Ambulatory Visit: Payer: Self-pay

## 2021-02-06 DIAGNOSIS — R079 Chest pain, unspecified: Secondary | ICD-10-CM | POA: Insufficient documentation

## 2021-02-06 DIAGNOSIS — R072 Precordial pain: Secondary | ICD-10-CM

## 2021-02-06 MED ORDER — NITROGLYCERIN 0.4 MG SL SUBL
0.8000 mg | SUBLINGUAL_TABLET | Freq: Once | SUBLINGUAL | Status: AC
  Administered 2021-02-06: 0.8 mg via SUBLINGUAL

## 2021-02-06 MED ORDER — IOHEXOL 350 MG/ML SOLN
100.0000 mL | Freq: Once | INTRAVENOUS | Status: AC | PRN
  Administered 2021-02-06: 100 mL via INTRAVENOUS

## 2021-02-06 MED ORDER — NITROGLYCERIN 0.4 MG SL SUBL
SUBLINGUAL_TABLET | SUBLINGUAL | Status: AC
  Filled 2021-02-06: qty 2

## 2021-02-09 ENCOUNTER — Telehealth: Payer: Self-pay

## 2021-02-09 DIAGNOSIS — H9192 Unspecified hearing loss, left ear: Secondary | ICD-10-CM | POA: Diagnosis not present

## 2021-02-09 DIAGNOSIS — H903 Sensorineural hearing loss, bilateral: Secondary | ICD-10-CM | POA: Diagnosis not present

## 2021-02-09 DIAGNOSIS — H9312 Tinnitus, left ear: Secondary | ICD-10-CM | POA: Diagnosis not present

## 2021-02-09 NOTE — Telephone Encounter (Signed)
Spoke with patient regarding results and recommendation.  Patient verbalizes understanding and is agreeable to plan of care. Advised patient to call back with any issues or concerns.  

## 2021-02-09 NOTE — Telephone Encounter (Signed)
-----   Message from Richardo Priest, MD sent at 02/08/2021 12:20 PM EDT ----- Normal or stable result  This is a good result coronary arteries cardiac findings are normal.

## 2021-02-18 DIAGNOSIS — G319 Degenerative disease of nervous system, unspecified: Secondary | ICD-10-CM | POA: Diagnosis not present

## 2021-02-18 DIAGNOSIS — H903 Sensorineural hearing loss, bilateral: Secondary | ICD-10-CM | POA: Diagnosis not present

## 2021-02-18 DIAGNOSIS — H9192 Unspecified hearing loss, left ear: Secondary | ICD-10-CM | POA: Diagnosis not present

## 2021-02-19 DIAGNOSIS — S5012XA Contusion of left forearm, initial encounter: Secondary | ICD-10-CM | POA: Diagnosis not present

## 2021-02-19 DIAGNOSIS — S060X9A Concussion with loss of consciousness of unspecified duration, initial encounter: Secondary | ICD-10-CM | POA: Diagnosis not present

## 2021-02-19 DIAGNOSIS — F419 Anxiety disorder, unspecified: Secondary | ICD-10-CM | POA: Diagnosis not present

## 2021-03-02 DIAGNOSIS — H903 Sensorineural hearing loss, bilateral: Secondary | ICD-10-CM | POA: Diagnosis not present

## 2021-03-02 DIAGNOSIS — H9319 Tinnitus, unspecified ear: Secondary | ICD-10-CM | POA: Diagnosis not present

## 2021-03-02 DIAGNOSIS — J342 Deviated nasal septum: Secondary | ICD-10-CM | POA: Diagnosis not present

## 2021-03-11 NOTE — Progress Notes (Signed)
Cardiology Office Note:    Date:  03/12/2021   ID:  Rachel Mata, DOB 09/16/1945, MRN [id number]  PCP:  Nicoletta Dress, MD  Cardiologist:  Shirlee More, MD    Referring MD: Nicoletta Dress, MD    ASSESSMENT:    1. Chest pain of uncertain etiology   2. Shortness of breath   3. Primary hypertension   4. Hyperlipidemia, unspecified hyperlipidemia type   5. Type 2 diabetes mellitus with peripheral neuropathy (HCC)    PLAN:    In order of problems listed above:  In summary results for cardiac CTA are better than expected on 100 mid 70s with diabetes hypertension hyperlipidemia calcium score is 0 and she has known CAD.  There is no cardiac mechanism for the shortness of breath she had in the emergency room and I suspect this was pulmonary in etiology and even potentially bronchospasm taking a nonselective beta-blocker she has had no recurrence.  In general the risk of cardiovascular events is quite low with a calcium score of 0 in the diabetic population the timeframe is shorter in some range of 2 years and with evidence of aortic atherosclerosis advised her to stay on her statin.  Her hypertension is well controlled she will continue her current treatment ARB thiazide diuretic lipids are ideal continue simvastatin and her diabetes mellitus is well controlled.  I told her I will see back in the future as needed.   Next appointment: As needed   Medication Adjustments/Labs and Tests Ordered: Current medicines are reviewed at length with the patient today.  Concerns regarding medicines are outlined above.  No orders of the defined types were placed in this encounter.  No orders of the defined types were placed in this encounter.   Chief Complaint  Patient presents with   Follow-up    After recent cardiac CTA    History of Present Illness:    Rachel Mata is a 75 y.o. female with a hx of hypertension type 2 diabetes mellitus and hyperlipidemia and syncope.  A 1 month cardiac  monitor at that time showed no bradycardia and no episodes of heart block she had symptomatic events with shortness of breath lightheadedness and fatigue unassociated with arrhythmia.  An echocardiogram [facility] in 2019 normal left ventricular size function filling pressures normal right ventricular function and pulmonary artery pressures and trace aortic regurgitation last seen 01/27/2021.She was seen in [place] ED with the abrupt onset of shortness of breath 11/30/2020.  Respiratory rate 36/min heart rate 85 bpm blood pressure 112/46 and oxygen sat 100%.  CBC was normal hemoglobin 14.4 creatinine 0.80 potassium 4.3 chest x-ray was read as normal CTA of the chest showed no findings of pulmonary embolism there is a moderate hiatal hernia and a large nodules were noted in the left lobe of the thyroid.  She was discharged from the emergency room with a diagnosis of shortness of breath and was advised to follow-up with her primary care physician.  I independently reviewed her EKG which showed artifact low voltage nonspecific ST-T changes.   Compliance with diet, lifestyle and medications: Yes  We had nice opportunity to sit down for review the results of her cardiac CTA.  Although her coronary artery calcium score is 0 and she has evidence of aortic atherosclerosis she is diabetic I encouraged her to remain on her lipid-lowering treatment.  She has a hiatal hernia that is not uncommon and she is asymptomatic.  Her coronary arteries and aorta are normal.  In  summary she has no evidence of cardiac disease.  At this point time I told her I did not further testing and I really left at a loss for the episode when she was in the emergency room shortness of breath it is not cardiac in etiology.  She is taking a nonselective beta-blocker propranolol for tremor.  Its possible she may have had bronchospasm she tolerates her statin without muscle pain or weakness and she is having no edema chest pain shortness of  breath palpitation or syncope  He underwent cardiac CTA reported 02/06/2021 the calcium score was 0 he had no evidence of coronary artery disease the coronary arteries are normal in origin and course the thoracic aorta was normal in size and over read of the CT of the chest showed a moderate hiatal hernia and aortic atheroma sclerosis. Past Medical History:  Diagnosis Date   Acute kidney injury ([place]) 01/26/2021   Anemia    Chest pain 01/26/2021   Colon polyps 10/17/2017   Depression 10/17/2017   Diabetes mellitus without complication ([place]) 0000000   diet controlled   Dyslipidemia 01/26/2021   Edema of both legs 10/20/2017   Essential tremor 2019   Gastritis 10/17/2017   GERD (gastroesophageal reflux disease) 10/17/2017   Hiatal hernia 10/17/2017   Hypercalcemia 01/26/2021   Hyperlipidemia    Hypertension    Insulin pump in place 10/25/2017   Leg swelling 09/04/2018   Luetscher's syndrome 01/26/2021   Numbness in feet 10/20/2017   Osteoarthritis 10/17/2017   Paraesophageal hernia 01/26/2021   Peripheral neuropathy 10/17/2017   Peripheral vascular disease ([place])    PVD (peripheral vascular disease) ([place]) 10/17/2017   Rectocele 01/26/2021   Septic olecranon bursitis 10/17/2017   Septic olecranon bursitis of left elbow 01/26/2021   Shortness of breath 01/26/2021   Skin cancer 10/17/2017   Sleep apnea 2010   does not use cpap   Sleep apnea, obstructive 10/17/2017   Stress incontinence of urine 08/09/2017   Subacute vulvitis 08/09/2017   Syncope 10/17/2017   Thyroid nodule incidentally noted on imaging study 01/26/2021   Type 2 diabetes mellitus with peripheral neuropathy ([place]) 10/25/2017    Past Surgical History:  Procedure Laterality Date   ABDOMINAL HYSTERECTOMY     BLADDER SURGERY     CHOLECYSTECTOMY     COLONOSCOPY  05/28/2013   No polyps found. There was a spasm and muscular atrophy in the sigmoid colon. The cecum could not be identified from the distance but was not intubated. The patient had an increased pain  during the procedure and had mild vagal reaction.    ESOPHAGOGASTRODUODENOSCOPY  04/28/2012   Esophageal spasm. Bile reflux gastritis. Medium hiatal hernia.    PARAESOPHAGEAL HERNIA REPAIR     RECTOCELE REPAIR     UPPER GASTROINTESTINAL ENDOSCOPY     UPPER GASTROINTESTINAL ENDOSCOPY  08/19/2020   Dr Signe Colt    Current Medications: Current Meds  Medication Sig   amLODipine (NORVASC) 5 MG tablet Take 5 mg by mouth daily.   aspirin EC 81 MG tablet Take 1 tablet (81 mg total) by mouth daily. Swallow whole.   b complex vitamins capsule Take 1 capsule by mouth daily.   esomeprazole (NEXIUM) 40 MG capsule Take 40 mg by mouth daily at 12 noon.   fesoterodine (TOVIAZ) 4 MG TB24 tablet Take 4 mg by mouth daily.   losartan-hydrochlorothiazide (HYZAAR) 100-25 MG tablet Take 1 tablet by mouth daily.   nitroGLYCERIN (NITROSTAT) 0.4 MG SL tablet Place 1 tablet (0.4 mg total)  under the tongue every 5 (five) minutes as needed.   propranolol (INDERAL) 10 MG tablet Take 10 mg by mouth 3 (three) times daily.   simvastatin (ZOCOR) 20 MG tablet Take 20 mg by mouth daily.   venlafaxine XR (EFFEXOR-XR) 150 MG 24 hr capsule Take 150 mg by mouth daily with breakfast.   VITAMIN D PO Take 1 tablet by mouth daily at 6 (six) AM.     Allergies:   Penicillin g   Social History   Socioeconomic History   Marital status: Married    Spouse name: Not on file   Number of children: Not on file   Years of education: Not on file   Highest education level: Not on file  Occupational History   Not on file  Tobacco Use   Smoking status: Never   Smokeless tobacco: Never  Vaping Use   Vaping Use: Never used  Substance and Sexual Activity   Alcohol use: Yes    Comment: occasional wine   Drug use: Never   Sexual activity: Not on file  Other Topics Concern   Not on file  Social History Narrative   Not on file   Social Determinants of Health   Financial Resource Strain: Not on file  Food Insecurity: Not  on file  Transportation Needs: Not on file  Physical Activity: Not on file  Stress: Not on file  Social Connections: Not on file     Family History: The patient's family history includes Asthma in her mother; COPD in her mother; Colon polyps in her sister; Diabetes in her paternal grandfather and paternal grandmother; Emphysema in her mother; Heart attack in her father, maternal grandfather, and maternal grandmother; Heart disease in her father; Hypercholesterolemia in her sister; Prostate cancer in her father. There is no history of Colon cancer, Esophageal cancer, Stomach cancer, or Rectal cancer. ROS:   Please see the history of present illness.    All other systems reviewed and are negative.  EKGs/Labs/Other Studies Reviewed:    The following studies were reviewed today:    Recent Labs: 10/29/2020: ALT 25; Hemoglobin 14.8; Platelets 255; TSH 0.900 02/02/2021: BUN 23; Creatinine, Ser 0.94; Potassium 3.8; Sodium 141  Recent Lipid Panel Most recent 09/30/2020 lipids are ideal with LDL 54 cholesterol 150 triglycerides 183 LDL 66  Physical Exam:    VS:  BP 101/69 (BP Location: Left Arm, Patient Position: Sitting, Cuff Size: Normal)   Pulse 82   Ht '5\' 4"'$  (1.626 m)   Wt 173 lb 3.2 oz (78.6 kg)   SpO2 97%   BMI 29.73 kg/m     Wt Readings from Last 3 Encounters:  03/12/21 173 lb 3.2 oz (78.6 kg)  01/27/21 175 lb (79.4 kg)  10/29/20 172 lb 8 oz (78.2 kg)     GEN:  Well nourished, well developed in no acute distress HEENT: Normal NECK: No JVD; No carotid bruits LYMPHATICS: No lymphadenopathy CARDIAC: RRR, no murmurs, rubs, gallops RESPIRATORY:  Clear to auscultation without rales, wheezing or rhonchi  ABDOMEN: Soft, non-tender, non-distended MUSCULOSKELETAL:  No edema; No deformity  SKIN: Warm and dry NEUROLOGIC:  Alert and oriented x 3 PSYCHIATRIC:  Normal affect    Signed, Shirlee More, MD  03/12/2021 10:14 AM    [place]

## 2021-03-12 ENCOUNTER — Encounter: Payer: Self-pay | Admitting: Cardiology

## 2021-03-12 ENCOUNTER — Ambulatory Visit (INDEPENDENT_AMBULATORY_CARE_PROVIDER_SITE_OTHER): Payer: Medicare Other | Admitting: Cardiology

## 2021-03-12 ENCOUNTER — Other Ambulatory Visit: Payer: Self-pay

## 2021-03-12 VITALS — BP 101/69 | HR 82 | Ht 64.0 in | Wt 173.2 lb

## 2021-03-12 DIAGNOSIS — E785 Hyperlipidemia, unspecified: Secondary | ICD-10-CM | POA: Diagnosis not present

## 2021-03-12 DIAGNOSIS — E1142 Type 2 diabetes mellitus with diabetic polyneuropathy: Secondary | ICD-10-CM

## 2021-03-12 DIAGNOSIS — R0602 Shortness of breath: Secondary | ICD-10-CM

## 2021-03-12 DIAGNOSIS — R079 Chest pain, unspecified: Secondary | ICD-10-CM

## 2021-03-12 DIAGNOSIS — I10 Essential (primary) hypertension: Secondary | ICD-10-CM | POA: Diagnosis not present

## 2021-03-12 NOTE — Patient Instructions (Signed)
Medication Instructions:   Your physician recommends that you continue on your current medications as directed. Please refer to the Current Medication list given to you today.   *If you need a refill on your cardiac medications before your next appointment, please call your pharmacy*   Lab Work: [place]   If you have labs (blood work) drawn today and your tests are completely normal, you will receive your results only by: [place] (if you have MyChart) OR A paper copy in the mail If you have any lab test that is abnormal or we need to change your treatment, we will call you to review the results.   Testing/Procedures: NONE ORDERED  TODAY    Follow-Up: At [facility], you and your health needs are our priority.  As part of our continuing mission to provide you with exceptional heart care, we have created designated Provider Care Teams.  These Care Teams include your primary Cardiologist (physician) and Advanced Practice Providers (APPs -  Physician Assistants and Nurse Practitioners) who all work together to provide you with the care you need, when you need it.  We recommend signing up for the patient portal called "MyChart".  Sign up information is provided on this After Visit Summary.  MyChart is used to connect with patients for Virtual Visits (Telemedicine).  Patients are able to view lab/test results, encounter notes, upcoming appointments, etc.  Non-urgent messages can be sent to your provider as well.   To learn more about what you can do with MyChart, go to [email or web address].    Your next appointment:  CONTACT [facility] [place]  AS NEEDED FOR  ANY CARDIAC RELATED SYMPTOMS  The format for your next appointment:   In Person  Provider:   Shirlee More, MD   Other Instructions

## 2021-04-02 DIAGNOSIS — I10 Essential (primary) hypertension: Secondary | ICD-10-CM | POA: Diagnosis not present

## 2021-04-02 DIAGNOSIS — E1142 Type 2 diabetes mellitus with diabetic polyneuropathy: Secondary | ICD-10-CM | POA: Diagnosis not present

## 2021-04-02 DIAGNOSIS — F418 Other specified anxiety disorders: Secondary | ICD-10-CM | POA: Diagnosis not present

## 2021-04-02 DIAGNOSIS — D509 Iron deficiency anemia, unspecified: Secondary | ICD-10-CM | POA: Diagnosis not present

## 2021-04-02 DIAGNOSIS — E785 Hyperlipidemia, unspecified: Secondary | ICD-10-CM | POA: Diagnosis not present

## 2021-04-02 DIAGNOSIS — G250 Essential tremor: Secondary | ICD-10-CM | POA: Diagnosis not present

## 2021-04-02 DIAGNOSIS — R0602 Shortness of breath: Secondary | ICD-10-CM | POA: Diagnosis not present

## 2021-04-24 DIAGNOSIS — Z23 Encounter for immunization: Secondary | ICD-10-CM | POA: Diagnosis not present

## 2021-05-29 DIAGNOSIS — Z20828 Contact with and (suspected) exposure to other viral communicable diseases: Secondary | ICD-10-CM | POA: Diagnosis not present

## 2021-06-01 DIAGNOSIS — R110 Nausea: Secondary | ICD-10-CM | POA: Diagnosis not present

## 2021-06-01 DIAGNOSIS — G250 Essential tremor: Secondary | ICD-10-CM | POA: Diagnosis not present

## 2021-06-12 DIAGNOSIS — R053 Chronic cough: Secondary | ICD-10-CM | POA: Diagnosis not present

## 2021-06-12 DIAGNOSIS — K449 Diaphragmatic hernia without obstruction or gangrene: Secondary | ICD-10-CM | POA: Diagnosis not present

## 2021-06-15 DIAGNOSIS — R053 Chronic cough: Secondary | ICD-10-CM | POA: Diagnosis not present

## 2021-06-15 DIAGNOSIS — R059 Cough, unspecified: Secondary | ICD-10-CM | POA: Diagnosis not present

## 2021-06-15 DIAGNOSIS — R0602 Shortness of breath: Secondary | ICD-10-CM | POA: Diagnosis not present

## 2021-06-15 DIAGNOSIS — K449 Diaphragmatic hernia without obstruction or gangrene: Secondary | ICD-10-CM | POA: Diagnosis not present

## 2021-06-24 ENCOUNTER — Emergency Department (HOSPITAL_BASED_OUTPATIENT_CLINIC_OR_DEPARTMENT_OTHER)
Admission: EM | Admit: 2021-06-24 | Discharge: 2021-06-24 | Disposition: A | Discharge status: Home / Self Care | Payer: Medicare Other | Attending: Emergency Medicine | Admitting: Emergency Medicine

## 2021-06-24 ENCOUNTER — Emergency Department (HOSPITAL_BASED_OUTPATIENT_CLINIC_OR_DEPARTMENT_OTHER): Payer: Medicare Other

## 2021-06-24 ENCOUNTER — Encounter: Payer: Self-pay | Admitting: Gastroenterology

## 2021-06-24 ENCOUNTER — Other Ambulatory Visit: Payer: Self-pay

## 2021-06-24 ENCOUNTER — Ambulatory Visit (INDEPENDENT_AMBULATORY_CARE_PROVIDER_SITE_OTHER): Payer: Medicare Other | Admitting: Gastroenterology

## 2021-06-24 ENCOUNTER — Encounter (HOSPITAL_BASED_OUTPATIENT_CLINIC_OR_DEPARTMENT_OTHER): Payer: Self-pay | Admitting: *Deleted

## 2021-06-24 VITALS — BP 128/72 | HR 89 | Ht 63.0 in | Wt 170.4 lb

## 2021-06-24 DIAGNOSIS — Z20822 Contact with and (suspected) exposure to covid-19: Secondary | ICD-10-CM | POA: Diagnosis not present

## 2021-06-24 DIAGNOSIS — K529 Noninfective gastroenteritis and colitis, unspecified: Secondary | ICD-10-CM | POA: Diagnosis not present

## 2021-06-24 DIAGNOSIS — E1140 Type 2 diabetes mellitus with diabetic neuropathy, unspecified: Secondary | ICD-10-CM | POA: Insufficient documentation

## 2021-06-24 DIAGNOSIS — M4856XA Collapsed vertebra, not elsewhere classified, lumbar region, initial encounter for fracture: Secondary | ICD-10-CM | POA: Diagnosis not present

## 2021-06-24 DIAGNOSIS — K3189 Other diseases of stomach and duodenum: Secondary | ICD-10-CM

## 2021-06-24 DIAGNOSIS — K6389 Other specified diseases of intestine: Secondary | ICD-10-CM | POA: Diagnosis not present

## 2021-06-24 DIAGNOSIS — Z85828 Personal history of other malignant neoplasm of skin: Secondary | ICD-10-CM | POA: Insufficient documentation

## 2021-06-24 DIAGNOSIS — K449 Diaphragmatic hernia without obstruction or gangrene: Secondary | ICD-10-CM | POA: Diagnosis not present

## 2021-06-24 DIAGNOSIS — R918 Other nonspecific abnormal finding of lung field: Secondary | ICD-10-CM | POA: Diagnosis not present

## 2021-06-24 DIAGNOSIS — E860 Dehydration: Secondary | ICD-10-CM

## 2021-06-24 DIAGNOSIS — Z79899 Other long term (current) drug therapy: Secondary | ICD-10-CM | POA: Insufficient documentation

## 2021-06-24 DIAGNOSIS — R112 Nausea with vomiting, unspecified: Secondary | ICD-10-CM | POA: Diagnosis not present

## 2021-06-24 DIAGNOSIS — I10 Essential (primary) hypertension: Secondary | ICD-10-CM | POA: Insufficient documentation

## 2021-06-24 DIAGNOSIS — R109 Unspecified abdominal pain: Secondary | ICD-10-CM

## 2021-06-24 DIAGNOSIS — E876 Hypokalemia: Secondary | ICD-10-CM | POA: Diagnosis not present

## 2021-06-24 DIAGNOSIS — R1084 Generalized abdominal pain: Secondary | ICD-10-CM | POA: Diagnosis not present

## 2021-06-24 DIAGNOSIS — N281 Cyst of kidney, acquired: Secondary | ICD-10-CM | POA: Diagnosis not present

## 2021-06-24 LAB — COMPREHENSIVE METABOLIC PANEL
ALT: 23 U/L (ref 0–44)
AST: 25 U/L (ref 15–41)
Albumin: 4.1 g/dL (ref 3.5–5.0)
Alkaline Phosphatase: 66 U/L (ref 38–126)
Anion gap: 11 (ref 5–15)
BUN: 25 mg/dL — ABNORMAL HIGH (ref 8–23)
CO2: 27 mmol/L (ref 22–32)
Calcium: 11 mg/dL — ABNORMAL HIGH (ref 8.9–10.3)
Chloride: 98 mmol/L (ref 98–111)
Creatinine, Ser: 0.89 mg/dL (ref 0.44–1.00)
GFR, Estimated: >60 mL/min (ref >60)
Glucose, Bld: 104 mg/dL — ABNORMAL HIGH (ref 70–99)
Potassium: 3 mmol/L — ABNORMAL LOW (ref 3.5–5.1)
Sodium: 136 mmol/L (ref 135–145)
Total Bilirubin: 0.8 mg/dL (ref 0.3–1.2)
Total Protein: 7.2 g/dL (ref 6.5–8.1)

## 2021-06-24 LAB — CBC WITH DIFFERENTIAL/PLATELET
Abs Immature Granulocytes: 0.03 10*3/uL (ref 0.00–0.07)
Basophils Absolute: 0 10*3/uL (ref 0.0–0.1)
Basophils Relative: 0 %
Eosinophils Absolute: 0 10*3/uL (ref 0.0–0.5)
Eosinophils Relative: 0 %
HCT: 40.5 % (ref 36.0–46.0)
Hemoglobin: 14.1 g/dL (ref 12.0–15.0)
Immature Granulocytes: 0 %
Lymphocytes Relative: 17 %
Lymphs Abs: 1.7 10*3/uL (ref 0.7–4.0)
MCH: 30.7 pg (ref 26.0–34.0)
MCHC: 34.8 g/dL (ref 30.0–36.0)
MCV: 88 fL (ref 80.0–100.0)
Monocytes Absolute: 0.6 10*3/uL (ref 0.1–1.0)
Monocytes Relative: 5 %
Neutro Abs: 8.1 10*3/uL — ABNORMAL HIGH (ref 1.7–7.7)
Neutrophils Relative %: 78 %
Platelets: 209 10*3/uL (ref 150–400)
RBC: 4.6 MIL/uL (ref 3.87–5.11)
RDW: 12.6 % (ref 11.5–15.5)
WBC: 10.4 10*3/uL (ref 4.0–10.5)
nRBC: 0 % (ref 0.0–0.2)

## 2021-06-24 LAB — LACTIC ACID, PLASMA
Lactic Acid, Venous: 1.1 mmol/L (ref 0.5–1.9)
Lactic Acid, Venous: 1.4 mmol/L (ref 0.5–1.9)

## 2021-06-24 LAB — LIPASE, BLOOD: Lipase: 58 U/L — ABNORMAL HIGH (ref 11–51)

## 2021-06-24 LAB — RESP PANEL BY RT-PCR (FLU A&B, COVID) ARPGX2
Influenza A by PCR: NEGATIVE
Influenza B by PCR: NEGATIVE
SARS Coronavirus 2 by RT PCR: NEGATIVE

## 2021-06-24 MED ORDER — SODIUM CHLORIDE 0.9 % IV BOLUS: 1000.0000 mL | Freq: Once | INTRAVENOUS | Status: DC

## 2021-06-24 MED ORDER — HYDROCODONE-ACETAMINOPHEN 5-325 MG PO TABS: 1.0000 | ORAL_TABLET | ORAL | 0 refills | Status: DC | PRN

## 2021-06-24 MED ORDER — ONDANSETRON HCL 4 MG PO TABS
4.0000 mg | ORAL_TABLET | Freq: Once | ORAL | Status: DC
  Filled 2021-06-24: qty 1

## 2021-06-24 MED ORDER — ONDANSETRON HCL 4 MG PO TABS: 4.0000 mg | ORAL_TABLET | Freq: Three times a day (TID) | ORAL | 0 refills | Status: DC | PRN

## 2021-06-24 MED ORDER — ONDANSETRON 4 MG PO TBDP
4.0000 mg | ORAL_TABLET | Freq: Once | ORAL | Status: AC
  Administered 2021-06-24: 4 mg via ORAL

## 2021-06-24 MED ORDER — ONDANSETRON HCL 4 MG/2ML IJ SOLN
4.0000 mg | Freq: Once | INTRAMUSCULAR | Status: AC
  Administered 2021-06-24: 4 mg via INTRAVENOUS
  Filled 2021-06-24: qty 2

## 2021-06-24 MED ORDER — ONDANSETRON 4 MG PO TBDP
ORAL_TABLET | ORAL | Status: AC
  Filled 2021-06-24: qty 1

## 2021-06-24 MED ORDER — SODIUM CHLORIDE 0.9 % IV BOLUS
1000.0000 mL | Freq: Once | INTRAVENOUS | Status: AC
  Administered 2021-06-24: 1000 mL via INTRAVENOUS

## 2021-06-24 MED ORDER — MORPHINE SULFATE (PF) 4 MG/ML IV SOLN
4.0000 mg | Freq: Once | INTRAVENOUS | Status: AC
Start: 2021-06-24 — End: 2021-06-24
  Administered 2021-06-24: 4 mg via INTRAVENOUS
  Filled 2021-06-24: qty 1

## 2021-06-24 MED ORDER — AMOXICILLIN-POT CLAVULANATE 875-125 MG PO TABS: 1.0000 | ORAL_TABLET | Freq: Two times a day (BID) | ORAL | 0 refills | Status: AC

## 2021-06-24 MED ORDER — IOHEXOL 300 MG/ML  SOLN
100.0000 mL | Freq: Once | INTRAMUSCULAR | Status: AC | PRN
  Administered 2021-06-24: 100 mL via INTRAVENOUS

## 2021-06-24 MED ORDER — AMOXICILLIN-POT CLAVULANATE 875-125 MG PO TABS
1.0000 | ORAL_TABLET | Freq: Once | ORAL | Status: AC
  Administered 2021-06-24: 1 via ORAL
  Filled 2021-06-24: qty 1

## 2021-06-24 MED ORDER — POTASSIUM CHLORIDE CRYS ER 20 MEQ PO TBCR
40.0000 meq | EXTENDED_RELEASE_TABLET | Freq: Once | ORAL | Status: AC
  Administered 2021-06-24: 40 meq via ORAL
  Filled 2021-06-24: qty 2

## 2021-06-24 NOTE — ED Provider Notes (Signed)
[facility]ER[facility] DEPARTMENT Provider Note   CSN: [id number] Arrival date & time: 06/24/21  1528     History Chief Complaint  Patient presents with   Emesis    Rachel Mata is a 75 y.o. female.  The history is provided by the patient and medical records (Dr. Lyndel Safe with Gastroenterology).  Emesis Severity:  Severe Duration:  2 days Timing:  Constant Quality:  Stomach contents Progression:  Unchanged Chronicity:  Recurrent Recent urination:  Normal Relieved by:  Nothing Worsened by:  Nothing Ineffective treatments:  None tried Associated symptoms: abdominal pain   Associated symptoms: no chills, no cough, no diarrhea, no fever, no headaches, no sore throat and no URI   Risk factors: prior abdominal surgery       Past Medical History:  Diagnosis Date   Acute kidney injury ([place]) 01/26/2021   Anemia    Chest pain 01/26/2021   Colon polyps 10/17/2017   Depression 10/17/2017   Diabetes mellitus without complication ([place]) 5400   diet controlled   Dyslipidemia 01/26/2021   Edema of both legs 10/20/2017   Essential tremor 2019   Gastritis 10/17/2017   GERD (gastroesophageal reflux disease) 10/17/2017   Hiatal hernia 10/17/2017   Hypercalcemia 01/26/2021   Hyperlipidemia    Hypertension    Insulin pump in place 10/25/2017   Leg swelling 09/04/2018   Luetscher's syndrome 01/26/2021   Numbness in feet 10/20/2017   Osteoarthritis 10/17/2017   Paraesophageal hernia 01/26/2021   Peripheral neuropathy 10/17/2017   Peripheral vascular disease ([place])    PVD (peripheral vascular disease) ([place]) 10/17/2017   Rectocele 01/26/2021   Septic olecranon bursitis 10/17/2017   Septic olecranon bursitis of left elbow 01/26/2021   Shortness of breath 01/26/2021   Skin cancer 10/17/2017   Sleep apnea 2010   does not use cpap   Sleep apnea, obstructive 10/17/2017   Stress incontinence of urine 08/09/2017   Subacute vulvitis 08/09/2017   Syncope 10/17/2017   Thyroid nodule incidentally noted on imaging study  01/26/2021   Type 2 diabetes mellitus with peripheral neuropathy ([place]) 10/25/2017    Patient Active Problem List   Diagnosis Date Noted   Peripheral vascular disease ([place]) 01/26/2021   Acute kidney injury ([place]) 01/26/2021   Chest pain 01/26/2021   Dyslipidemia 01/26/2021   Hypercalcemia 01/26/2021   Luetscher's syndrome 01/26/2021   Rectocele 01/26/2021   Shortness of breath 01/26/2021   Thyroid nodule incidentally noted on imaging study 01/26/2021   Paraesophageal hernia 01/26/2021   Septic olecranon bursitis of left elbow 01/26/2021   Anemia 01/23/2021   Leg swelling 09/04/2018   Type 2 diabetes mellitus with peripheral neuropathy ([place]) 10/25/2017   Insulin pump in place 10/25/2017   Edema of both legs 10/20/2017   Numbness in feet 10/20/2017   Depression 10/17/2017   Syncope 10/17/2017   Peripheral neuropathy 10/17/2017   Hypertension 10/17/2017   Hiatal hernia 10/17/2017   Colon polyps 10/17/2017   Gastritis 10/17/2017   Hyperlipidemia 10/17/2017   Septic olecranon bursitis 10/17/2017   PVD (peripheral vascular disease) ([place]) 10/17/2017   Sleep apnea, obstructive 10/17/2017   Skin cancer 10/17/2017   GERD (gastroesophageal reflux disease) 10/17/2017   Osteoarthritis 10/17/2017   Stress incontinence of urine 08/09/2017   Subacute vulvitis 08/09/2017   Essential tremor 2019   Diabetes mellitus without complication ([place]) 8676   Sleep apnea 2010    Past Surgical History:  Procedure Laterality Date   ABDOMINAL HYSTERECTOMY     BLADDER SURGERY  CHOLECYSTECTOMY     COLONOSCOPY  05/28/2013   No polyps found. There was a spasm and muscular atrophy in the sigmoid colon. The cecum could not be identified from the distance but was not intubated. The patient had an increased pain during the procedure and had mild vagal reaction.    ESOPHAGOGASTRODUODENOSCOPY  04/28/2012   Esophageal spasm. Bile reflux gastritis. Medium hiatal hernia.    PARAESOPHAGEAL HERNIA REPAIR      RECTOCELE REPAIR     UPPER GASTROINTESTINAL ENDOSCOPY     UPPER GASTROINTESTINAL ENDOSCOPY  08/19/2020   Dr Meissenheimer     OB History   No obstetric history on file.     Family History  Problem Relation Age of Onset   Emphysema Mother    Asthma Mother    COPD Mother    Prostate cancer Father    Heart attack Father    Heart disease Father    Hypercholesterolemia Sister    Colon polyps Sister    Heart attack Maternal Grandmother    Heart attack Maternal Grandfather    Diabetes Paternal Grandmother    Diabetes Paternal Grandfather    Colon cancer Neg Hx    Esophageal cancer Neg Hx    Stomach cancer Neg Hx    Rectal cancer Neg Hx     Social History   Tobacco Use   Smoking status: Never   Smokeless tobacco: Never  Vaping Use   Vaping Use: Never used  Substance Use Topics   Alcohol use: Yes    Comment: occasional wine   Drug use: Never    Home Medications Prior to Admission medications   Medication Sig Start Date End Date Taking? Authorizing Provider  amLODipine (NORVASC) 5 MG tablet Take 5 mg by mouth daily.    Provider, Historical, Rachel Mata  b complex vitamins capsule Take 1 capsule by mouth daily.    Provider, Historical, Rachel Mata  esomeprazole (NEXIUM) 40 MG capsule Take 40 mg by mouth daily at 12 noon.    Provider, Historical, Rachel Mata  fesoterodine (TOVIAZ) 4 MG TB24 tablet Take 4 mg by mouth daily.    Provider, Historical, Rachel Mata  losartan-hydrochlorothiazide (HYZAAR) 100-25 MG tablet Take 1 tablet by mouth daily.    Provider, Historical, Rachel Mata  propranolol (INDERAL) 10 MG tablet Take 10 mg by mouth 3 (three) times daily. 10/15/17   Provider, Historical, Rachel Mata  simvastatin (ZOCOR) 20 MG tablet Take 20 mg by mouth daily.    Provider, Historical, Rachel Mata  venlafaxine XR (EFFEXOR-XR) 150 MG 24 hr capsule Take 150 mg by mouth daily with breakfast.    Provider, Historical, Rachel Mata  VITAMIN D PO Take 1 tablet by mouth daily at 6 (six) AM.    Provider, Historical, Rachel Mata    Allergies    Penicillin  g  Review of Systems   Review of Systems  Constitutional:  Negative for chills, diaphoresis, fatigue and fever.  HENT:  Negative for congestion and sore throat.   Respiratory:  Negative for cough, chest tightness, shortness of breath and wheezing.   Cardiovascular:  Negative for chest pain (epigastric pain more than chest) and palpitations.  Gastrointestinal:  Positive for abdominal pain, constipation, nausea and vomiting. Negative for diarrhea.  Genitourinary:  Negative for dysuria and frequency.  Musculoskeletal:  Negative for back pain, neck pain and neck stiffness.  Skin:  Negative for rash.  Neurological:  Negative for light-headedness and headaches.  Psychiatric/Behavioral:  Negative for agitation and confusion.   All other systems reviewed and are negative.  Physical  Exam Updated Vital Signs BP 93/63 (BP Location: Right Arm)   Pulse 75   Temp 98.2 F (36.8 C) (Oral)   Resp 18   Ht 5\' 3"  (1.6 m)   Wt 77.1 kg   SpO2 94%   BMI 30.11 kg/m   Physical Exam Vitals and nursing note reviewed.  Constitutional:      General: She is not in acute distress.    Appearance: She is well-developed. She is not ill-appearing, toxic-appearing or diaphoretic.  HENT:     Head: Normocephalic and atraumatic.     Nose: No congestion or rhinorrhea.  Eyes:     Conjunctiva/sclera: Conjunctivae normal.  Cardiovascular:     Rate and Rhythm: Normal rate and regular rhythm.     Heart sounds: No murmur heard. Pulmonary:     Effort: Pulmonary effort is normal. No respiratory distress.     Breath sounds: Normal breath sounds. No wheezing, rhonchi or rales.  Chest:     Chest wall: No tenderness.  Abdominal:     General: Abdomen is flat.     Palpations: Abdomen is soft.     Tenderness: There is abdominal tenderness. There is no right CVA tenderness, left CVA tenderness, guarding or rebound.  Musculoskeletal:        General: No swelling or tenderness.     Cervical back: Neck supple. No  tenderness.     Right lower leg: No edema.     Left lower leg: No edema.  Skin:    General: Skin is warm and dry.     Capillary Refill: Capillary refill takes less than 2 seconds.     Findings: No erythema or rash.  Neurological:     Mental Status: She is alert.     Sensory: No sensory deficit.     Motor: No weakness.  Psychiatric:        Mood and Affect: Mood normal.    ED Results / Procedures / Treatments   Labs (all labs ordered are listed, but only abnormal results are displayed) Labs Reviewed  CBC WITH DIFFERENTIAL/PLATELET - Abnormal; Notable for the following components:      Result Value   Neutro Abs 8.1 (*)    All other components within normal limits  COMPREHENSIVE METABOLIC PANEL - Abnormal; Notable for the following components:   Potassium 3.0 (*)    Glucose, Bld 104 (*)    BUN 25 (*)    Calcium 11.0 (*)    All other components within normal limits  LIPASE, BLOOD - Abnormal; Notable for the following components:   Lipase 58 (*)    All other components within normal limits  RESP PANEL BY RT-PCR (FLU A&B, COVID) ARPGX2  LACTIC ACID, PLASMA  LACTIC ACID, PLASMA    EKG None  Radiology CT CHEST ABDOMEN PELVIS W CONTRAST  Result Date: 06/24/2021 CLINICAL DATA:  Chest pain. Evaluate for volvulus. Known large hiatal hernia. Nausea and vomiting. EXAM: CT CHEST, ABDOMEN, AND PELVIS WITH CONTRAST TECHNIQUE: Multidetector CT imaging of the chest, abdomen and pelvis was performed following the standard protocol during bolus administration of intravenous contrast. CONTRAST:  170mL OMNIPAQUE IOHEXOL 300 MG/ML  SOLN COMPARISON:  CT heart 02/06/2021. CT abdomen and pelvis 11/05/2020. CT chest 11/30/2020. CT angiogram chest 04/25/2015. FINDINGS: CT CHEST FINDINGS Cardiovascular: No significant vascular findings. Normal heart size. No pericardial effusion. Mediastinum/Nodes: Bilateral thyroid nodules are again seen measuring up to 15 mm. The left thyroid gland is enlarged. There  are no enlarged mediastinal or hilar lymph  nodes. There is a small to moderate-sized hiatal hernia which has slightly decreased in size. Lungs/Pleura: There is some peripheral reticular opacities in a lower lung predominance similar to the prior study. There is a 6 mm nodule in the left lower lobe image 2/111, unchanged from 2016 and likely benign. The lungs are otherwise clear. There is no pleural effusion or pneumothorax. Musculoskeletal: Degenerative changes affect the spine. CT ABDOMEN PELVIS FINDINGS Hepatobiliary: No focal liver abnormality is seen. Status post cholecystectomy. No biliary dilatation. Pancreas: Unremarkable. No pancreatic ductal dilatation or surrounding inflammatory changes. Spleen: Normal in size without focal abnormality. Adrenals/Urinary Tract: There is a 2.8 cm cyst in the right kidney and a 2.9 cm cyst in the left kidney. There is no hydronephrosis or perinephric fluid. The adrenal glands and bladder are within normal limits. Stomach/Bowel: There is a small to moderate-sized hiatal hernia which has decreased in size. Questionable diverticulum seen at the gastroesophageal junction coronal image 6/61. Stomach is decompressed. There is mild wall thickening and inflammatory stranding involving the descending colon. There is no pneumatosis or free air. There is a large amount of stool in the colon proximal to this level. Appendix and small bowel are within normal limits. Vascular/Lymphatic: No significant vascular findings are present. No enlarged abdominal or pelvic lymph nodes. Reproductive: Status post hysterectomy. No adnexal masses. Other: There is no ascites. There are fat containing bilateral inguinal hernias. Musculoskeletal: There is mild compression deformity of the superior endplate of L1 without retropulsion of fracture fragments. This is new from the prior examination, but appears chronic. Multilevel degenerative changes affect the spine. IMPRESSION: 1. Wall thickening and  inflammation of the descending colon worrisome for nonspecific colitis. Large amount of stool in the proximal colon. 2. Small to moderate-sized hiatal hernia has decreased. 3. Questionable diverticulum at the gastroesophageal junction. This can be further evaluated with upper GI exam or endoscopy as clinically warranted. 4. Mild compression fracture of L1 appears chronic, but is new from 11/05/2020. Please correlate clinically for acuity. 5. Stable reticular opacities in the lower lungs, indeterminate. 6. Stable bilateral thyroid nodules with enlargement of the left thyroid. Recommend follow-up thyroid ultrasound when clinically appropriate if not performed previously. Electronically Signed   By: Ronney Asters M.D.   On: 06/24/2021 19:34    Procedures Procedures   Medications Ordered in ED Medications  sodium chloride 0.9 % bolus 1,000 mL (has no administration in time range)  amoxicillin-clavulanate (AUGMENTIN) 875-125 MG per tablet 1 tablet (has no administration in time range)  ondansetron (ZOFRAN) tablet 4 mg (has no administration in time range)  potassium chloride SA (KLOR-CON M) CR tablet 40 mEq (has no administration in time range)  sodium chloride 0.9 % bolus 1,000 mL (1,000 mLs Intravenous New Bag/Given 06/24/21 1923)  morphine 4 MG/ML injection 4 mg (4 mg Intravenous Given 06/24/21 1925)  ondansetron (ZOFRAN) injection 4 mg (4 mg Intravenous Given 06/24/21 1924)  iohexol (OMNIPAQUE) 300 MG/ML solution 100 mL (100 mLs Intravenous Contrast Given 06/24/21 1846)    ED Course  I have reviewed the triage vital signs and the nursing notes.  Pertinent labs & imaging results that were available during my care of the patient were reviewed by me and considered in my medical decision making (see chart for details).    MDM Rules/Calculators/A&P                           Jabrea Kallstrom is a 75 y.o. female with a  past medical history significant for hypertension, diabetes, peripheral vascular  disease, hyperlipidemia, and hiatal hernia with reportedly failed previous paraesophageal hernia repair who presents the direction of her gastroenterologist for rule out of volvulus.  According to the gastroenterologist whom I spoke with, patient has had nausea and vomiting for about 2 months but rapidly worsening over the last 24 hours.  She reports her pain getting up to 10 out of 10 at times with pain all across her abdomen going up towards her chest.  She reports has not tolerated any p.o. in the last 24 hours and vomited up any food or fluids.  She reports the pain is severe.  She denies any fevers, chills, ingestion, cough.  She denies any shortness of breath or palpitations.  She reports constipation and is not passing gas today and having more burping.  She reports no urinary changes.  Denies trauma.  On my exam, patient does have tender abdomen diffusely.  Bowel sounds were appreciated.  Lungs were clear and chest was nontender.  Flanks and back nontender.  Good pulses in extremities.  Patient uncomfortable.  Per plan with gastroenterology, will get the CT chest/abdomen/pelvis that he was wanting.  This will rule out bowel torsion, volvulus, or other complication with her known hiatal hernia.  We will also get screening labs.  He requested fluids and symptom management medications.  We will order pain medicine and nausea medicine now that she is in the room.  We will give more fluids.  Per his note, if the CT is reassuring, he would recommend switching from Nexium to Protonix 40 twice daily and may need nausea medicine with Zofran.  If CT shows concerning findings, anticipate admission for further management.  7:54 PM CT scan did not show evidence of volvulus fortunately.  It did show evidence of some nonspecific colitis but no obstruction.  We went over all the findings together.  Patient otherwise was found to have some low potassium which we will replete orally.  She was feeling much better  after fluids and medications.  Had a discussion with the patient and we will start antibiotics for the colitis with Augmentin.  She reports she has tolerated medication like this in the past although regular penicillin G did not seem to help with infection in the past remotely.  She is also amenable to getting prescription for nausea medicine and pain medicine.  We will give a dose of nausea medicine and Augmentin now as well as replete the potassium orally and she will pick up the prescriptions tomorrow with her pharmacy and will call GI tomorrow to be seen in the next few days.  She had no other questions or concerns and was discharged in good condition.    Final Clinical Impression(s) / ED Diagnoses Final diagnoses:  Colitis  Nausea and vomiting, unspecified vomiting type  Abdominal pain, unspecified abdominal location  Hypokalemia    Rx / DC Orders ED Discharge Orders          Ordered    amoxicillin-clavulanate (AUGMENTIN) 875-125 MG tablet  Every 12 hours        06/24/21 2000    HYDROcodone-acetaminophen (NORCO/VICODIN) 5-325 MG tablet  Every 4 hours PRN        06/24/21 2000    ondansetron (ZOFRAN) 4 MG tablet  Every 8 hours PRN        06/24/21 2000           Clinical Impression: 1. Colitis   2. Nausea and vomiting,  unspecified vomiting type   3. Abdominal pain, unspecified abdominal location   4. Hypokalemia     Disposition: Discharge  Condition: Good  I have discussed the results, Dx and Tx plan with the pt(& family if present). He/she/they expressed understanding and agree(s) with the plan. Discharge instructions discussed at great length. Strict return precautions discussed and pt &/or family have verbalized understanding of the instructions. No further questions at time of discharge.    New Prescriptions   AMOXICILLIN-CLAVULANATE (AUGMENTIN) 875-125 MG TABLET    Take 1 tablet by mouth every 12 (twelve) hours for 10 days.   HYDROCODONE-ACETAMINOPHEN  (NORCO/VICODIN) 5-325 MG TABLET    Take 1 tablet by mouth every 4 (four) hours as needed.   ONDANSETRON (ZOFRAN) 4 MG TABLET    Take 1 tablet (4 mg total) by mouth every 8 (eight) hours as needed.    Follow Up: Nicoletta Dress, Rachel Mata [address] [place] [address] [phone]     [place] [address] [id number] RN [facility] [place] [phone]    Your Gastroenterology team        Menashe Kafer, Rachel Allegra, Rachel Mata 06/24/21 2002

## 2021-06-24 NOTE — ED Triage Notes (Signed)
Sent from [facility] office for abd pain and n/v x 2 days HX h. Hernia

## 2021-06-24 NOTE — Patient Instructions (Signed)
If you are age 75 or older, your body mass index should be between 23-30. Your Body mass index is 30.18 kg/m. If this is out of the aforementioned range listed, please consider follow up with your Primary Care Provider.  If you are age 22 or younger, your body mass index should be between 19-25. Your Body mass index is 30.18 kg/m. If this is out of the aformentioned range listed, please consider follow up with your Primary Care Provider.   ________________________________________________________  The [facility] providers would like to encourage you to use [facility] to communicate with providers for non-urgent requests or questions.  Due to long hold times on the telephone, sending your provider a message by [facility] may be a faster and more efficient way to get a response.  Please allow 48 business hours for a response.  Please remember that this is for non-urgent requests.  _______________________________________________________  Please head to the ED. We are letting you know you are on the way  Thank you,  Dr. Jackquline [place]

## 2021-06-24 NOTE — Progress Notes (Signed)
Chief Complaint:   Referring Provider:  Nicoletta Dress, MD      ASSESSMENT AND PLAN;   #1. N/V with large HH. R/O gastric volvulus. Prev failed paraesophageal hernia repair  #2. H/O eso dysphagia s/p EGD with dil 17 mm savory with resolution of dysphagia (Dr. Lyda Jester) 08/19/2020. EGD- the eso was tortuous, gastric polyps (Bx-fundic gland polyps), large hiatal hernia, previous H/O failed paraesophageal hernia repair.  #3. Chronic constipation  Plan:  -To ED downstairs stat.  May need CT chest/abdo/pelvis.  -May need to switch from nexium to protonix 40 BID -May need zofran -Will need IVF/labs (CBC, CMP) -D/W pt and her husband. -D/W ED      HPI:    Kadence Mikkelson is a 75 y.o. female  With HTN, PVD, diet-controlled DM with peripheral neuropathy, hiatal hernia, HLD Accompanied by her husband  N/V x 2 months, bad over last 24 hrs-now intractable.  Having dry heaves even without eating.  Has not been able to keep anything down over the last 24 hours Associated epi pain. Feels weak  C/O "earthworn/ribbon like stools" with decreased caliber and occasional straining. Over the last several years. BMs 1/QOD. She has LLQ abdominal discomfort which gets better with defecation. No weight loss. No melena or hematochezia.  No fever or chills.  We have gone over last colonoscopy in detail.  No further dysphagia after dilation.  She has increased water intake.  Previous GI procedures: Colonoscopy 12/31/2020 -Colonic polyps status post polypectomy. Bx-tubular adenomas -Mild sigmoid diverticulosis. Highly tortuous colon. " Fixed sigmoid" -Non-bleeding external internal hemorrhoids.  Colonoscopy November 2014: Difficult procedure, peds scope. Dr. Melina Copa  EGD with Dil 08/19/2020 Dr. 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  CT AP with contrast 10/2020 1. No  acute findings within the abdomen or pelvis. 2. Moderate to large hiatal hernia. 3. Bilateral kidney cysts. 4. Aortic atherosclerosis.   Wt Readings from Last 3 Encounters:  06/24/21 170 lb 6 oz (77.3 kg)  03/12/21 173 lb 3.2 oz (78.6 kg)  01/27/21 175 lb (79.4 kg)    Past Medical History:  Diagnosis Date   Acute kidney injury ([place]) 01/26/2021   Anemia    Chest pain 01/26/2021   Colon polyps 10/17/2017   Depression 10/17/2017   Diabetes mellitus without complication ([place]) 3762   diet controlled   Dyslipidemia 01/26/2021   Edema of both legs 10/20/2017   Essential tremor 2019   Gastritis 10/17/2017   GERD (gastroesophageal reflux disease) 10/17/2017   Hiatal hernia 10/17/2017   Hypercalcemia 01/26/2021   Hyperlipidemia    Hypertension    Insulin pump in place 10/25/2017   Leg swelling 09/04/2018   Luetscher's syndrome 01/26/2021   Numbness in feet 10/20/2017   Osteoarthritis 10/17/2017   Paraesophageal hernia 01/26/2021   Peripheral neuropathy 10/17/2017   Peripheral vascular disease ([place])    PVD (peripheral vascular disease) ([place]) 10/17/2017   Rectocele 01/26/2021   Septic olecranon bursitis 10/17/2017   Septic olecranon bursitis of left elbow 01/26/2021   Shortness of breath 01/26/2021   Skin cancer 10/17/2017   Sleep apnea 2010   does not use cpap   Sleep apnea, obstructive 10/17/2017   Stress incontinence of urine 08/09/2017   Subacute vulvitis 08/09/2017   Syncope 10/17/2017   Thyroid nodule incidentally noted on imaging study 01/26/2021   Type 2 diabetes mellitus with peripheral neuropathy ([place]) 10/25/2017    Past Surgical History:  Procedure Laterality Date   ABDOMINAL HYSTERECTOMY     BLADDER SURGERY     CHOLECYSTECTOMY     COLONOSCOPY  05/28/2013   No polyps found. There was a spasm and muscular atrophy in the sigmoid colon. The cecum could not be identified from the distance but was not intubated. The patient had an increased pain during the procedure and had mild vagal reaction.     ESOPHAGOGASTRODUODENOSCOPY  04/28/2012   Esophageal spasm. Bile reflux gastritis. Medium hiatal hernia.    PARAESOPHAGEAL HERNIA REPAIR     RECTOCELE REPAIR     UPPER GASTROINTESTINAL ENDOSCOPY     UPPER GASTROINTESTINAL ENDOSCOPY  08/19/2020   Dr Meissenheimer    Family History  Problem Relation Age of Onset   Emphysema Mother    Asthma Mother    COPD Mother    Prostate cancer Father    Heart attack Father    Heart disease Father    Hypercholesterolemia Sister    Colon polyps Sister    Heart attack Maternal Grandmother    Heart attack Maternal Grandfather    Diabetes Paternal Grandmother    Diabetes Paternal Grandfather    Colon cancer Neg Hx    Esophageal cancer Neg Hx    Stomach cancer Neg Hx    Rectal cancer Neg Hx     Social History   Tobacco Use   Smoking status: Never   Smokeless tobacco: Never  Vaping Use   Vaping Use: Never used  Substance Use Topics   Alcohol use: Yes    Comment: occasional wine   Drug use: Never    Current Outpatient Medications  Medication Sig Dispense Refill   amLODipine (NORVASC) 5 MG tablet Take 5 mg by mouth daily.     b complex vitamins capsule Take 1 capsule by mouth daily.     esomeprazole (NEXIUM) 40 MG capsule Take 40 mg by mouth daily at 12 noon.     fesoterodine (TOVIAZ) 4 MG TB24 tablet Take 4 mg by mouth daily.     losartan-hydrochlorothiazide (HYZAAR) 100-25 MG tablet Take 1 tablet by mouth daily.     propranolol (INDERAL) 10 MG tablet Take 10 mg by mouth 3 (three) times daily.  3   simvastatin (ZOCOR) 20 MG tablet Take 20 mg by mouth daily.     venlafaxine XR (EFFEXOR-XR) 150 MG 24 hr capsule Take 150 mg by mouth daily with breakfast.     VITAMIN D PO Take 1 tablet by mouth daily at 6 (six) AM.     No current facility-administered medications for this visit.    Allergies  Allergen Reactions   Penicillin G Other (See Comments)    Pt states it doesn't work    Review of Systems:  neg     Physical Exam:     BP 128/72 (BP Location: Left Arm, Patient Position: Sitting, Cuff Size: Normal)   Pulse 89   Ht 5\' 3"  (1.6 m)   Wt 170 lb 6 oz (77.3 kg)   SpO2 97%   BMI 30.18 kg/m  Filed Weights   06/24/21 1450  Weight: 170 lb 6 oz (77.3 kg)   Constitutional: On wheelchair Psychiatric: Normal mood and affect. Behavior is normal. HEENT: Pupils normal.  Conjunctivae are normal. No scleral icterus. Abdominal: Soft, mildly distended. nontender. Bowel sounds active throughout. There are no masses palpable. No hepatomegaly. Rectal:  defered Neurological: Alert and oriented to person place and time. Skin: Skin is warm and dry. No rashes noted.  [name], MD 06/24/2021, 3:08 PM  Cc: Nicoletta Dress, MD

## 2021-06-24 NOTE — Discharge Instructions (Signed)
Your history, exam and work-up today are consistent with a colitis causing the nausea, vomiting, and abdominal pain.  There is no evidence of obstruction and no evidence of volvulus with her hiatal hernia which appears smaller than before.  I did show the small diverticula between the esophagus and the stomach as we discussed so please follow-up with your GI doctor in the next few days for reassessment.  Your potassium was low which we repleted and we rehydrated you.  Given your improvement in symptoms and overall well appearance, we feel you are safe for discharge home however please use the pain medicine, nausea medicine, and antibiotics to treat your symptoms and the colitis.  If any symptoms change or worsen acutely, please return to the nearest emergency department.  I sent the prescriptions to the [facility] in [place] for you.

## 2021-06-25 ENCOUNTER — Telehealth: Payer: Self-pay | Admitting: Gastroenterology

## 2021-06-25 NOTE — Telephone Encounter (Signed)
Inbound call from patient, states you sent her to the ER believed she had a twisted hernia.   Dr. at hospital stated that it was a inflamed intestine.   Just wanted to inform you.

## 2021-06-29 DIAGNOSIS — K529 Noninfective gastroenteritis and colitis, unspecified: Secondary | ICD-10-CM | POA: Diagnosis not present

## 2021-06-29 DIAGNOSIS — R112 Nausea with vomiting, unspecified: Secondary | ICD-10-CM | POA: Diagnosis not present

## 2021-06-29 DIAGNOSIS — K449 Diaphragmatic hernia without obstruction or gangrene: Secondary | ICD-10-CM | POA: Diagnosis not present

## 2021-07-02 DIAGNOSIS — S22080A Wedge compression fracture of T11-T12 vertebra, initial encounter for closed fracture: Secondary | ICD-10-CM | POA: Diagnosis not present

## 2021-07-02 DIAGNOSIS — S32010A Wedge compression fracture of first lumbar vertebra, initial encounter for closed fracture: Secondary | ICD-10-CM | POA: Diagnosis not present

## 2021-07-03 ENCOUNTER — Encounter: Payer: Self-pay | Admitting: Gastroenterology

## 2021-07-03 ENCOUNTER — Ambulatory Visit (INDEPENDENT_AMBULATORY_CARE_PROVIDER_SITE_OTHER): Payer: Medicare Other | Admitting: Gastroenterology

## 2021-07-03 ENCOUNTER — Other Ambulatory Visit: Payer: Self-pay

## 2021-07-03 VITALS — BP 100/68 | HR 79 | Ht 63.0 in | Wt 169.2 lb

## 2021-07-03 DIAGNOSIS — R112 Nausea with vomiting, unspecified: Secondary | ICD-10-CM

## 2021-07-03 DIAGNOSIS — K5909 Other constipation: Secondary | ICD-10-CM

## 2021-07-03 MED ORDER — ONDANSETRON 4 MG PO TBDP: 4.0000 mg | ORAL_TABLET | Freq: Four times a day (QID) | ORAL | 0 refills | Status: DC | PRN

## 2021-07-03 NOTE — Patient Instructions (Addendum)
If you are age 75 or older, your body mass index should be between 23-30. Your Body mass index is 29.98 kg/m. If this is out of the aforementioned range listed, please consider follow up with your Primary Care Provider.  If you are age 53 or younger, your body mass index should be between 19-25. Your Body mass index is 29.98 kg/m. If this is out of the aformentioned range listed, please consider follow up with your Primary Care Provider.   ________________________________________________________  The [facility] providers would like to encourage you to use [facility] to communicate with providers for non-urgent requests or questions.  Due to long hold times on the telephone, sending your provider a message by [facility] may be a faster and more efficient way to get a response.  Please allow 48 business hours for a response.  Please remember that this is for non-urgent requests.  _______________________________________________________  We have given you samples of the following medication to take: Linzess take 1 a day Please call in about 2 weeks to let us know how you are doing.  Please purchase the following medications over the counter and take as directed: Miralax 17g daily  Continue protonix  Increase water intake.  Thank you,  Dr. Jackquline Denmark

## 2021-07-03 NOTE — Progress Notes (Signed)
Chief Complaint:   Referring Provider:  Nicoletta Dress, MD      ASSESSMENT AND PLAN;   #1. N/V (resolved)  with large [place].Prev failed paraesophageal hernia repair. Neg CT chest/A/P 06/24/2021 for volvulus.  #2. H/O eso dysphagia s/p EGD with dil 17 mm savory with resolution of dysphagia (Dr. Lyda Jester) 08/19/2020. EGD- the eso was tortuous, gastric polyps (Bx-fundic gland polyps), large hiatal hernia, previous H/O failed paraesophageal hernia repair.  #3. Chronic constipation. Neg colon 12/2020 except for colonic polyps. Next due 12/2023.  Plan:  -Linzess 157mcg po QD #30 (samples given) -Miralax 17g po QD -Continue protonix 40 QD -May need zofran 4mg  OTC Q8hrs prn #30. -Increase water intake. -D/W pt and her husband. -Copy of CT given to pt. She will discuss thyroid nodules with Dr. Delena Bali      HPI:    Rachel Mata is a 75 y.o. female  With HTN, PVD, diet-controlled DM with peripheral neuropathy, hiatal hernia, HLD Accompanied by her husband  No further N/V  Still with constipation. Last BM 3 days ago- hard pellet-like stools without any melena or hematochezia.  Associated with abdominal bloating and lower abdominal discomfort.  She does drink plenty of water.  She took fleets solution p.o. with good results previously.   Of note-ED visit for N/V 06/24/2021. CTA chest/Abdo/pelvis with contrast was neg for gastric volvulus.  It did show thyroid nodules, DJD, constipation, mod HH. (Have given copy of CT to patient).  She had normal labs.  Nexium switched to Protonix with good results.   She has increased water intake.  Previous GI procedures: Colonoscopy 12/31/2020 -Colonic polyps status post polypectomy. Bx-tubular adenomas. Rpt 3 yrs -Mild sigmoid diverticulosis. Highly tortuous colon. " Fixed sigmoid" -Non-bleeding external internal hemorrhoids.  Colonoscopy November 2014: Difficult procedure, peds scope. Dr. Melina Copa  EGD with Dil 08/19/2020 Dr.  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  CT AP with contrast 10/2020 1. No acute findings within the abdomen or pelvis. 2. Moderate to large hiatal hernia. 3. Bilateral kidney cysts. 4. Aortic atherosclerosis.   Wt Readings from Last 3 Encounters:  07/03/21 169 lb 4 oz (76.8 kg)  06/24/21 170 lb (77.1 kg)  06/24/21 170 lb 6 oz (77.3 kg)    Past Medical History:  Diagnosis Date   Acute kidney injury ([place]) 01/26/2021   Anemia    Chest pain 01/26/2021   Colon polyps 10/17/2017   Depression 10/17/2017   Diabetes mellitus without complication ([place]) 1027   diet controlled   Dyslipidemia 01/26/2021   Edema of both legs 10/20/2017   Essential tremor 2019   Gastritis 10/17/2017   GERD (gastroesophageal reflux disease) 10/17/2017   Hiatal hernia 10/17/2017   Hypercalcemia 01/26/2021   Hyperlipidemia    Hypertension    Insulin pump in place 10/25/2017   Leg swelling 09/04/2018   Luetscher's syndrome 01/26/2021   Numbness in feet 10/20/2017   Osteoarthritis 10/17/2017   Paraesophageal hernia 01/26/2021   Peripheral neuropathy 10/17/2017   Peripheral vascular disease ([place])    PVD (peripheral vascular disease) ([place]) 10/17/2017   Rectocele 01/26/2021   Septic olecranon bursitis 10/17/2017   Septic olecranon bursitis of left elbow 01/26/2021   Shortness of breath 01/26/2021   Skin cancer 10/17/2017   Sleep apnea 2010   does not use cpap   Sleep apnea, obstructive 10/17/2017   Stress incontinence of urine 08/09/2017   Subacute vulvitis 08/09/2017  Syncope 10/17/2017   Thyroid nodule incidentally noted on imaging study 01/26/2021   Type 2 diabetes mellitus with peripheral neuropathy ([place]) 10/25/2017    Past Surgical History:  Procedure Laterality Date   ABDOMINAL HYSTERECTOMY     BLADDER SURGERY     CHOLECYSTECTOMY     COLONOSCOPY  05/28/2013   No polyps found. There was a spasm and muscular atrophy  in the sigmoid colon. The cecum could not be identified from the distance but was not intubated. The patient had an increased pain during the procedure and had mild vagal reaction.    ESOPHAGOGASTRODUODENOSCOPY  04/28/2012   Esophageal spasm. Bile reflux gastritis. Medium hiatal hernia.    PARAESOPHAGEAL HERNIA REPAIR     RECTOCELE REPAIR     UPPER GASTROINTESTINAL ENDOSCOPY     UPPER GASTROINTESTINAL ENDOSCOPY  08/19/2020   Dr Meissenheimer    Family History  Problem Relation Age of Onset   Emphysema Mother    Asthma Mother    COPD Mother    Prostate cancer Father    Heart attack Father    Heart disease Father    Hypercholesterolemia Sister    Colon polyps Sister    Heart attack Maternal Grandmother    Heart attack Maternal Grandfather    Diabetes Paternal Grandmother    Diabetes Paternal Grandfather    Colon cancer Neg Hx    Esophageal cancer Neg Hx    Stomach cancer Neg Hx    Rectal cancer Neg Hx     Social History   Tobacco Use   Smoking status: Never   Smokeless tobacco: Never  Vaping Use   Vaping Use: Never used  Substance Use Topics   Alcohol use: Yes    Comment: occasional wine   Drug use: Never    Current Outpatient Medications  Medication Sig Dispense Refill   amLODipine (NORVASC) 5 MG tablet Take 5 mg by mouth daily.     amoxicillin-clavulanate (AUGMENTIN) 875-125 MG tablet Take 1 tablet by mouth every 12 (twelve) hours for 10 days. 20 tablet 0   b complex vitamins capsule Take 1 capsule by mouth daily.     esomeprazole (NEXIUM) 40 MG capsule Take 40 mg by mouth daily at 12 noon.     fesoterodine (TOVIAZ) 4 MG TB24 tablet Take 4 mg by mouth daily.     losartan-hydrochlorothiazide (HYZAAR) 100-25 MG tablet Take 1 tablet by mouth daily.     ondansetron (ZOFRAN) 4 MG tablet Take 1 tablet (4 mg total) by mouth every 8 (eight) hours as needed. 12 tablet 0   propranolol (INDERAL) 10 MG tablet Take 10 mg by mouth 3 (three) times daily.  3   simvastatin (ZOCOR)  20 MG tablet Take 20 mg by mouth daily.     venlafaxine XR (EFFEXOR-XR) 150 MG 24 hr capsule Take 150 mg by mouth daily with breakfast.     VITAMIN D PO Take 1 tablet by mouth daily at 6 (six) AM.     No current facility-administered medications for this visit.    Allergies  Allergen Reactions   Penicillin G Other (See Comments)    Pt states it doesn't work    Review of Systems:  neg     Physical Exam:    BP 100/68    Pulse 79    Ht 5\' 3"  (1.6 m)    Wt 169 lb 4 oz (76.8 kg)    SpO2 94%    BMI 29.98 kg/m  [employer]   07/03/21  1405  Weight: 169 lb 4 oz (76.8 kg)   Constitutional: On wheelchair Psychiatric: Normal mood and affect. Behavior is normal. HEENT: Pupils normal.  Conjunctivae are normal. No scleral icterus. Abdominal: Soft, mildly distended. nontender. Bowel sounds active throughout. There are no masses palpable. No hepatomegaly. Rectal:  defered Neurological: Alert and oriented to person place and time. Skin: Skin is warm and dry. No rashes noted.   [name], MD 07/03/2021, 2:09 PM  Cc: Nicoletta Dress, MD

## 2021-07-06 ENCOUNTER — Telehealth: Payer: Self-pay | Admitting: Gastroenterology

## 2021-07-06 NOTE — Telephone Encounter (Signed)
Pt states that she was recently in Dr. Lyndel Safe office and had Constipation. Pt stated that Dr. Lyndel Safe advised her to eat prunes and given samples of Linzess. Pt states that she is having multiple Loose stools a day now  and cannot stay out of the bathroom. Pt was notified with the results that she is having she can stop the Linzess to prevent further Diarrhea. Pt verbalized understanding with all questions answered.

## 2021-07-06 NOTE — Telephone Encounter (Signed)
Inbound call from patient, stated that Dr. Lyndel Safe give her samples of Linzess on 12/16 when she come in for a office visit. Patient stated that she can not stay out of the bathroom and she wants to know if Dr. Lyndel Safe still wants her to continue taking it. Please advise.

## 2021-07-08 DIAGNOSIS — E042 Nontoxic multinodular goiter: Secondary | ICD-10-CM | POA: Diagnosis not present

## 2021-07-10 ENCOUNTER — Telehealth: Payer: Self-pay | Admitting: Gastroenterology

## 2021-07-10 DIAGNOSIS — K5900 Constipation, unspecified: Secondary | ICD-10-CM

## 2021-07-10 NOTE — Telephone Encounter (Signed)
Pt called in office several days ago stating Dr. Lyndel Safe wanted her to try the [place] and  that she is having multiple Loose stools a day now  and cannot stay out of the bathroom. Pt was notified with the results that she is having she can stop the Linzess to prevent further Diarrhea.  Pt states today that she has not had a BM in several days. Pt was notified to start back on the [place]. Pt states that she is still eating prunes and drinking prune Juice.  Pt verbalized understanding with all questions answered.

## 2021-07-10 NOTE — Telephone Encounter (Signed)
Patient called.  She states she spoke with you the other day and you advised her to stop the Linzess.  What she is seeing is that with the Linzess, she is having runny stools.  Without the Linzess, she's not having a BM at all.  Please advise.  Thank you.

## 2021-07-15 DIAGNOSIS — E042 Nontoxic multinodular goiter: Secondary | ICD-10-CM | POA: Diagnosis not present

## 2021-07-17 NOTE — Telephone Encounter (Signed)
Pt states that she has been taking the Linzess 145 mg daily and it has been doing a Therapist, music for her. Pt states that she received samples from the Dr. Gabriel Carina and requesting a prescription as she only has 4 pills left.  Please Advise

## 2021-07-17 NOTE — Telephone Encounter (Signed)
Patient called and states that the Linzess seems to be working and she has 4 pills left and wants to know if she can get a proscription. Please advise.

## 2021-07-17 NOTE — Telephone Encounter (Signed)
Left Message for pt to call back  °

## 2021-07-20 NOTE — Telephone Encounter (Signed)
Please call in Linzess 145 mcg p.o. once a day 30 with 11 refills If expensive, would need patient assistance and samples until then RG

## 2021-07-21 ENCOUNTER — Other Ambulatory Visit: Payer: Self-pay

## 2021-07-21 DIAGNOSIS — E041 Nontoxic single thyroid nodule: Secondary | ICD-10-CM | POA: Diagnosis not present

## 2021-07-21 DIAGNOSIS — M545 Low back pain, unspecified: Secondary | ICD-10-CM | POA: Diagnosis not present

## 2021-07-21 DIAGNOSIS — S22080A Wedge compression fracture of T11-T12 vertebra, initial encounter for closed fracture: Secondary | ICD-10-CM | POA: Diagnosis not present

## 2021-07-21 DIAGNOSIS — E042 Nontoxic multinodular goiter: Secondary | ICD-10-CM | POA: Diagnosis not present

## 2021-07-21 DIAGNOSIS — S32010A Wedge compression fracture of first lumbar vertebra, initial encounter for closed fracture: Secondary | ICD-10-CM | POA: Diagnosis not present

## 2021-07-21 DIAGNOSIS — X58XXXA Exposure to other specified factors, initial encounter: Secondary | ICD-10-CM | POA: Diagnosis not present

## 2021-07-21 DIAGNOSIS — K5900 Constipation, unspecified: Secondary | ICD-10-CM

## 2021-07-21 MED ORDER — LINACLOTIDE 145 MCG PO CAPS: 145.0000 ug | ORAL_CAPSULE | Freq: Every day | ORAL | 4 refills | Status: DC

## 2021-07-21 MED ORDER — LINACLOTIDE 145 MCG PO CAPS: 145.0000 ug | ORAL_CAPSULE | Freq: Every day | ORAL | 11 refills | Status: DC

## 2021-07-21 NOTE — Addendum Note (Signed)
Addended by: Curlene Labrum E on: 07/21/2021 02:11 PM   Modules accepted: Orders

## 2021-07-21 NOTE — Telephone Encounter (Signed)
Patient husband is calling in to follow up on prescription

## 2021-07-21 NOTE — Telephone Encounter (Signed)
90 day script sent to mail order pharmacy and patient will come pick up 30 day supply from the [facility].

## 2021-07-21 NOTE — Addendum Note (Signed)
Addended by: Curlene Labrum E on: 07/21/2021 01:48 PM   Modules accepted: Orders

## 2021-07-21 NOTE — Telephone Encounter (Signed)
Patient calling in regards to [place] proscription. States that it could be up to 15 days before she gets her proscription in the mail.   Asking if there is any way that she can get a small proscription sent to her pharmacy to hold her over until she gets medication in the mail. Please advise.

## 2021-07-23 ENCOUNTER — Encounter (HOSPITAL_COMMUNITY): Payer: Self-pay | Admitting: Radiology

## 2021-07-23 DIAGNOSIS — S32010A Wedge compression fracture of first lumbar vertebra, initial encounter for closed fracture: Secondary | ICD-10-CM | POA: Diagnosis not present

## 2021-07-23 DIAGNOSIS — S22080A Wedge compression fracture of T11-T12 vertebra, initial encounter for closed fracture: Secondary | ICD-10-CM | POA: Diagnosis not present

## 2021-08-04 DIAGNOSIS — G250 Essential tremor: Secondary | ICD-10-CM | POA: Diagnosis not present

## 2021-09-16 DIAGNOSIS — Z20822 Contact with and (suspected) exposure to covid-19: Secondary | ICD-10-CM | POA: Diagnosis not present

## 2021-09-30 DIAGNOSIS — D509 Iron deficiency anemia, unspecified: Secondary | ICD-10-CM | POA: Diagnosis not present

## 2021-09-30 DIAGNOSIS — Z9181 History of falling: Secondary | ICD-10-CM | POA: Diagnosis not present

## 2021-09-30 DIAGNOSIS — Z1331 Encounter for screening for depression: Secondary | ICD-10-CM | POA: Diagnosis not present

## 2021-09-30 DIAGNOSIS — E785 Hyperlipidemia, unspecified: Secondary | ICD-10-CM | POA: Diagnosis not present

## 2021-09-30 DIAGNOSIS — E1142 Type 2 diabetes mellitus with diabetic polyneuropathy: Secondary | ICD-10-CM | POA: Diagnosis not present

## 2021-09-30 DIAGNOSIS — F418 Other specified anxiety disorders: Secondary | ICD-10-CM | POA: Diagnosis not present

## 2021-09-30 DIAGNOSIS — I10 Essential (primary) hypertension: Secondary | ICD-10-CM | POA: Diagnosis not present

## 2021-09-30 DIAGNOSIS — G250 Essential tremor: Secondary | ICD-10-CM | POA: Diagnosis not present

## 2021-10-22 DIAGNOSIS — S32010A Wedge compression fracture of first lumbar vertebra, initial encounter for closed fracture: Secondary | ICD-10-CM | POA: Diagnosis not present

## 2021-10-22 DIAGNOSIS — S22080A Wedge compression fracture of T11-T12 vertebra, initial encounter for closed fracture: Secondary | ICD-10-CM | POA: Diagnosis not present

## 2021-10-27 DIAGNOSIS — E119 Type 2 diabetes mellitus without complications: Secondary | ICD-10-CM | POA: Diagnosis not present

## 2021-10-27 DIAGNOSIS — H40013 Open angle with borderline findings, low risk, bilateral: Secondary | ICD-10-CM | POA: Diagnosis not present

## 2021-10-27 DIAGNOSIS — H25813 Combined forms of age-related cataract, bilateral: Secondary | ICD-10-CM | POA: Diagnosis not present

## 2021-11-03 DIAGNOSIS — Z20822 Contact with and (suspected) exposure to covid-19: Secondary | ICD-10-CM | POA: Diagnosis not present

## 2022-03-09 IMAGING — CT CT ABD-PELV W/ CM
2 of 5 series · 16 of 46 positions shown, 18 images · IV contrast (Omnipaque)
Comparison: None.

CLINICAL DATA: Chronic constipation. Interval change in stool
texture/color

EXAM:
CT ABDOMEN AND PELVIS WITH CONTRAST
TECHNIQUE: Multidetector CT imaging of the abdomen and pelvis was performed
using the standard protocol following bolus administration of
intravenous contrast.
CONTRAST:  100mL OMNIPAQUE IOHEXOL 300 MG/ML  SOLN

[Series 2: axial st · axial · 0.94mm/px · z∈[-450,-10]mm · 13 of 98 slices shown, 15 images]
[im 5/98  soft-tissue]
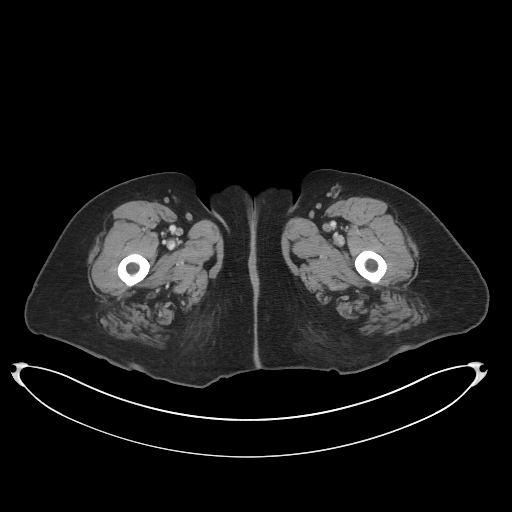
[im 5/98  bone]
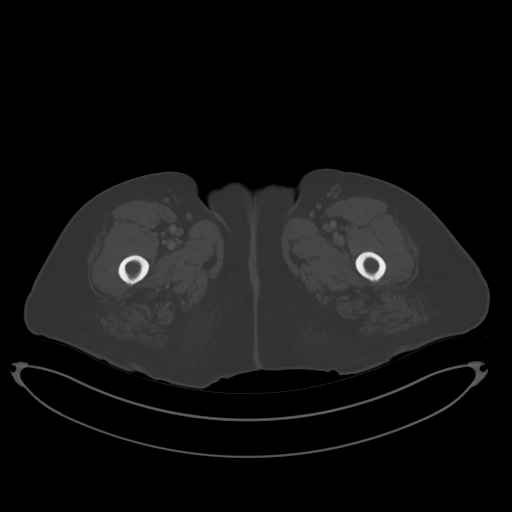
[im 14/98  soft-tissue]
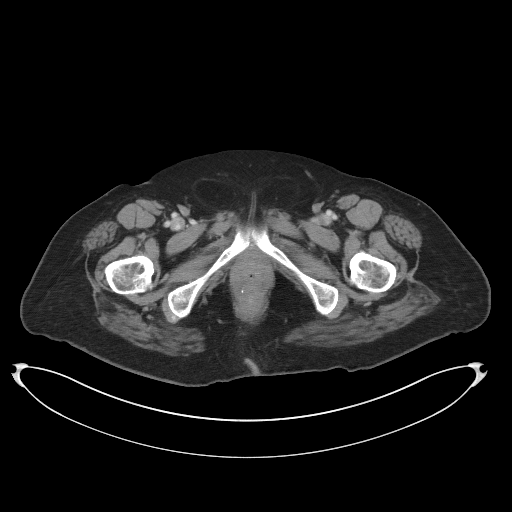
[im 19/98  soft-tissue]
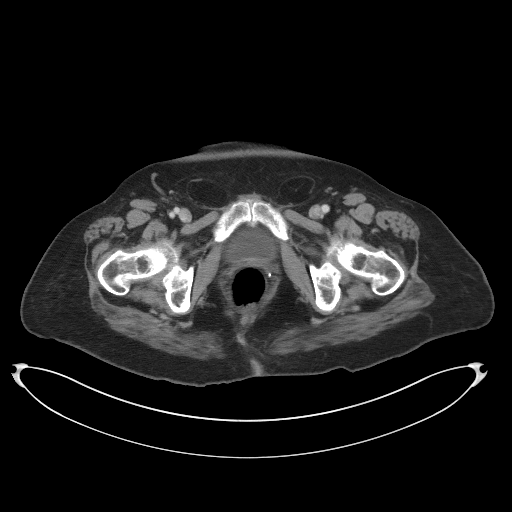
[im 28/98  soft-tissue]
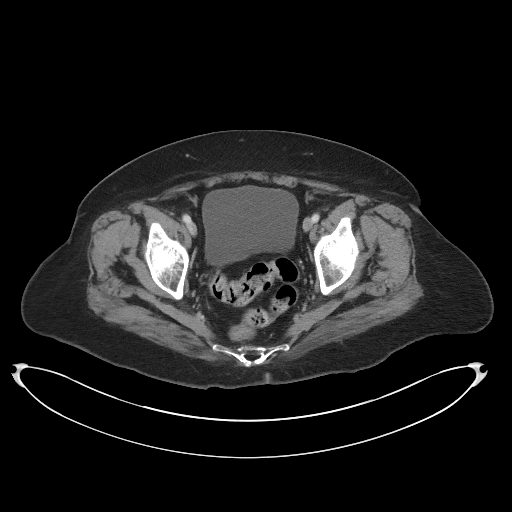
[im 33/98  soft-tissue]
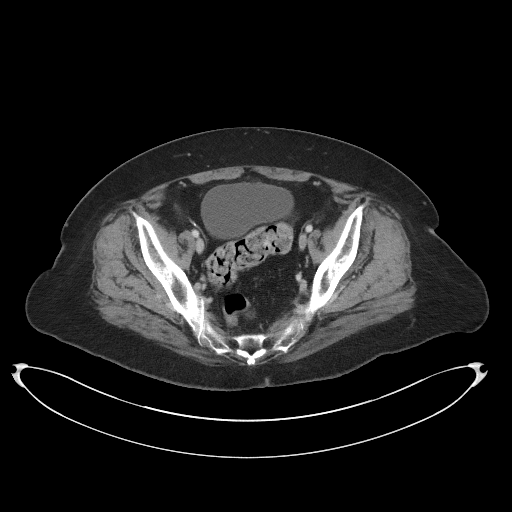
[im 42/98  soft-tissue]
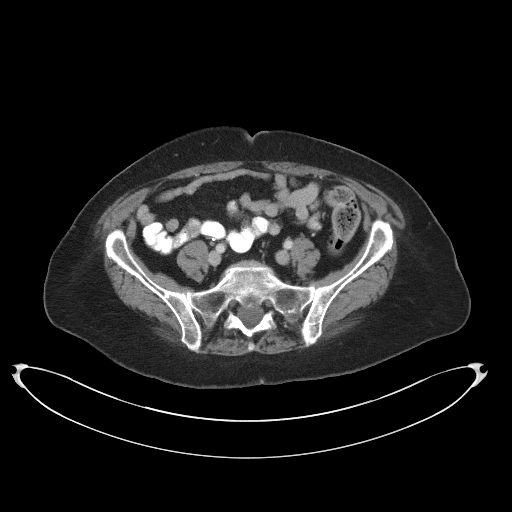
[im 51/98  soft-tissue]
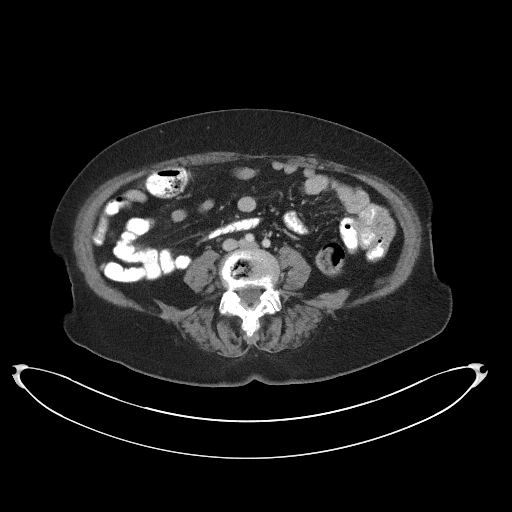
[im 56/98  soft-tissue]
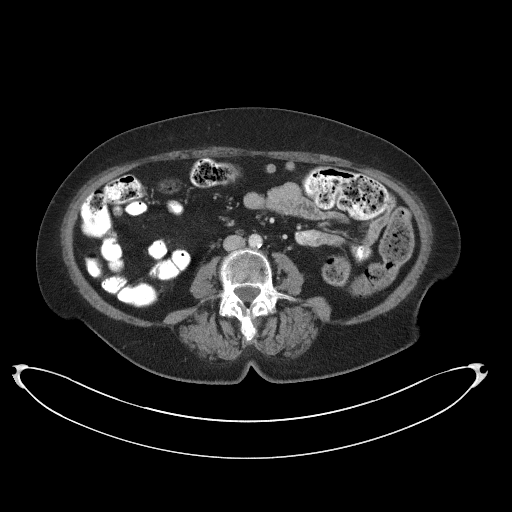
[im 65/98  soft-tissue]
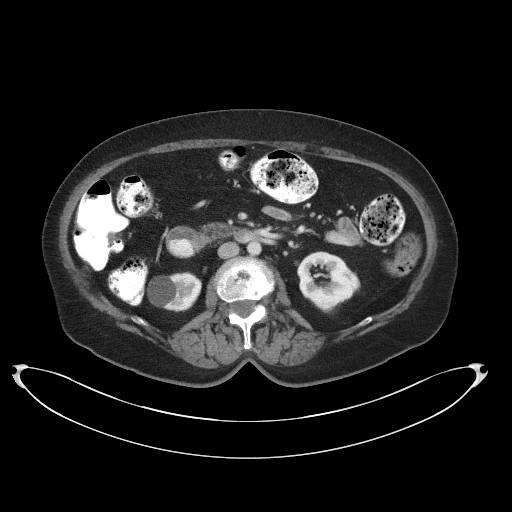
[im 65/98  bone]
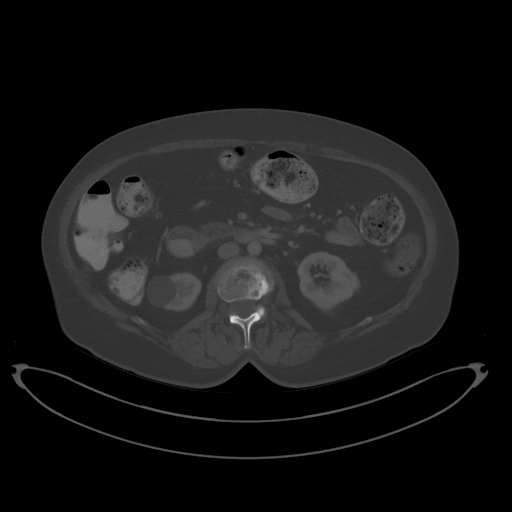
[im 70/98  soft-tissue]
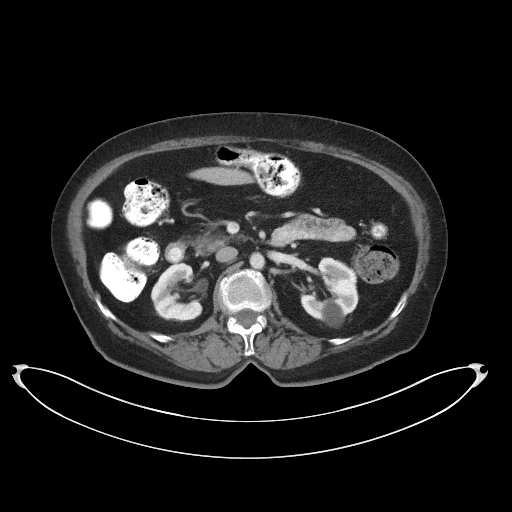
[im 79/98  soft-tissue]
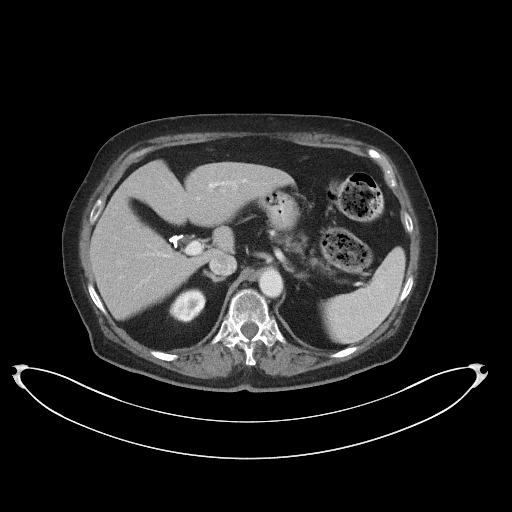
[im 84/98  soft-tissue]
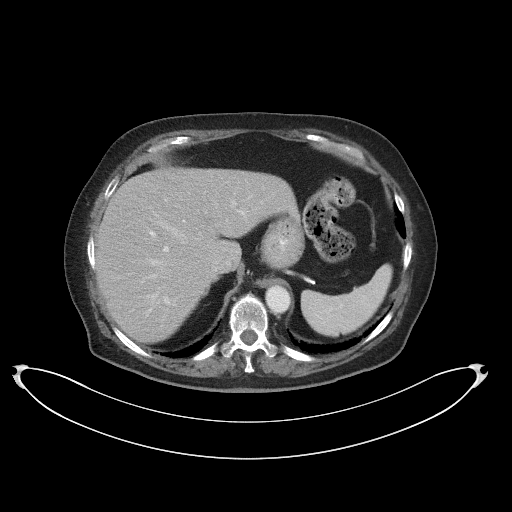
[im 93/98  soft-tissue]
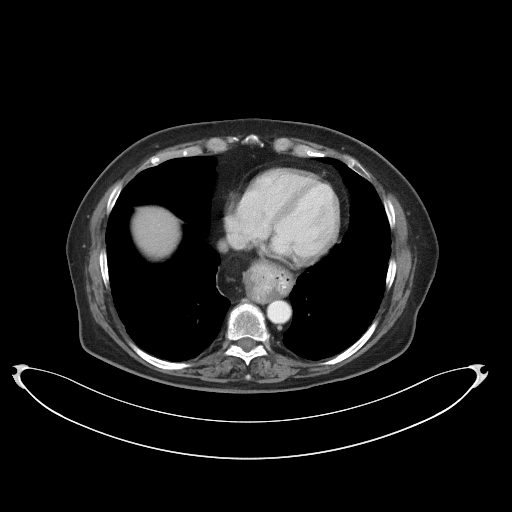

[Series 5: coronal st · coronal · 0.85mm/px · 3 of 90 slices shown]
[im 30/90  soft-tissue]
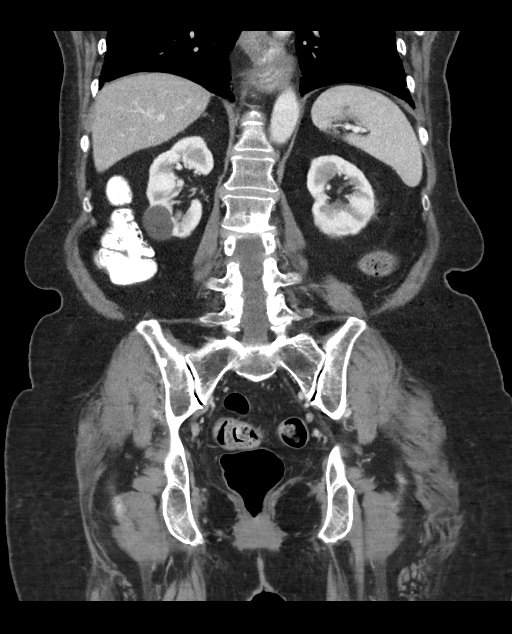
[im 40/90  soft-tissue]
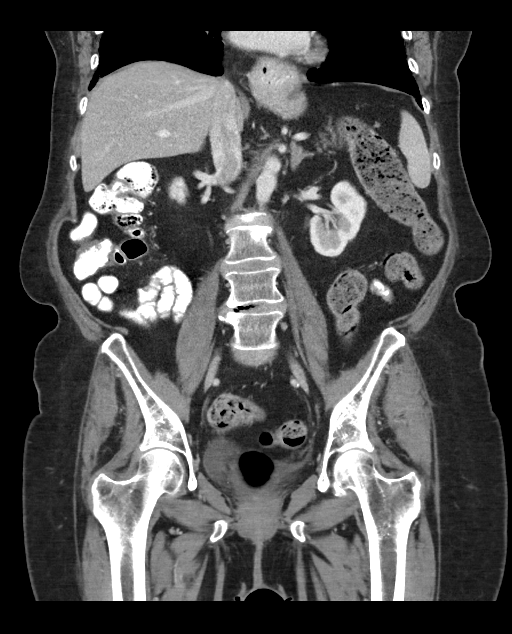
[im 50/90  soft-tissue]
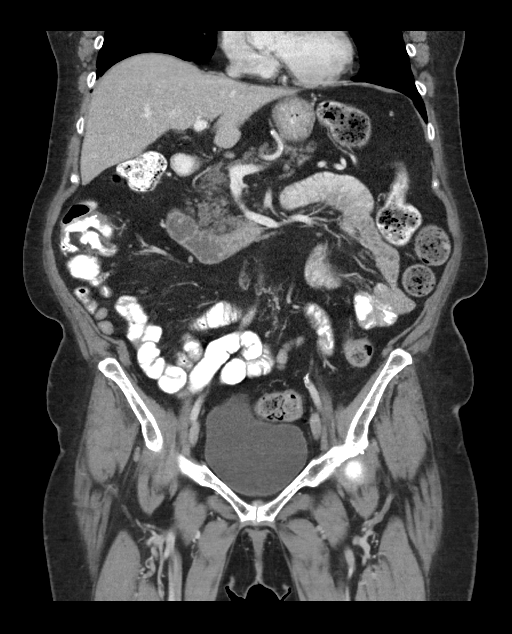

[16 of 46 positions shown; findings below may reference images not displayed]

FINDINGS: Lower chest: No acute abnormality.

Hepatobiliary: No suspicious liver abnormality. Previous
cholecystectomy. No significant biliary dilatation.

Pancreas: Unremarkable. No pancreatic ductal dilatation or
surrounding inflammatory changes.

Spleen: Normal in size without focal abnormality.

Adrenals/Urinary Tract: Normal adrenal glands. Bilateral simple
appearing kidney cysts measure up to 2.7 cm. No mass or
hydronephrosis identified bilaterally. Urinary bladder is within
normal limits.

Stomach/Bowel: Moderate to large hiatal hernia. The appendix is
visualized and appears normal. No bowel wall thickening,
inflammation or distension.

Vascular/Lymphatic: Aortic atherosclerosis. No aneurysm. No
abdominopelvic adenopathy.

Reproductive: Status post hysterectomy. No adnexal masses.

Other: No free fluid or fluid collections. Small fat containing
inguinal hernias are noted.

Musculoskeletal: Thoracolumbar degenerative disc disease. This is
most advanced at L2-3.
IMPRESSION: 1. No acute findings within the abdomen or pelvis.
2. Moderate to large hiatal hernia.
3. Bilateral kidney cysts.
4. Aortic atherosclerosis.

Aortic Atherosclerosis ([id number]).

## 2022-04-05 DIAGNOSIS — D509 Iron deficiency anemia, unspecified: Secondary | ICD-10-CM | POA: Diagnosis not present

## 2022-04-05 DIAGNOSIS — I10 Essential (primary) hypertension: Secondary | ICD-10-CM | POA: Diagnosis not present

## 2022-04-05 DIAGNOSIS — E1142 Type 2 diabetes mellitus with diabetic polyneuropathy: Secondary | ICD-10-CM | POA: Diagnosis not present

## 2022-04-05 DIAGNOSIS — F418 Other specified anxiety disorders: Secondary | ICD-10-CM | POA: Diagnosis not present

## 2022-04-05 DIAGNOSIS — Z1231 Encounter for screening mammogram for malignant neoplasm of breast: Secondary | ICD-10-CM | POA: Diagnosis not present

## 2022-04-05 DIAGNOSIS — E785 Hyperlipidemia, unspecified: Secondary | ICD-10-CM | POA: Diagnosis not present

## 2022-04-05 DIAGNOSIS — G250 Essential tremor: Secondary | ICD-10-CM | POA: Diagnosis not present

## 2022-04-14 DIAGNOSIS — M898X1 Other specified disorders of bone, shoulder: Secondary | ICD-10-CM | POA: Diagnosis not present

## 2022-04-14 DIAGNOSIS — K449 Diaphragmatic hernia without obstruction or gangrene: Secondary | ICD-10-CM | POA: Diagnosis not present

## 2022-04-14 DIAGNOSIS — N281 Cyst of kidney, acquired: Secondary | ICD-10-CM | POA: Diagnosis not present

## 2022-04-14 DIAGNOSIS — E860 Dehydration: Secondary | ICD-10-CM | POA: Diagnosis not present

## 2022-04-14 DIAGNOSIS — M47814 Spondylosis without myelopathy or radiculopathy, thoracic region: Secondary | ICD-10-CM | POA: Diagnosis not present

## 2022-04-14 DIAGNOSIS — A084 Viral intestinal infection, unspecified: Secondary | ICD-10-CM | POA: Diagnosis not present

## 2022-04-14 DIAGNOSIS — I959 Hypotension, unspecified: Secondary | ICD-10-CM | POA: Diagnosis not present

## 2022-04-14 DIAGNOSIS — M5136 Other intervertebral disc degeneration, lumbar region: Secondary | ICD-10-CM | POA: Diagnosis not present

## 2022-04-14 DIAGNOSIS — E876 Hypokalemia: Secondary | ICD-10-CM | POA: Diagnosis not present

## 2022-04-14 DIAGNOSIS — N179 Acute kidney failure, unspecified: Secondary | ICD-10-CM | POA: Diagnosis not present

## 2022-04-14 DIAGNOSIS — R6884 Jaw pain: Secondary | ICD-10-CM | POA: Diagnosis not present

## 2022-04-14 DIAGNOSIS — R0600 Dyspnea, unspecified: Secondary | ICD-10-CM | POA: Diagnosis not present

## 2022-04-14 DIAGNOSIS — K4020 Bilateral inguinal hernia, without obstruction or gangrene, not specified as recurrent: Secondary | ICD-10-CM | POA: Diagnosis not present

## 2022-04-14 DIAGNOSIS — I10 Essential (primary) hypertension: Secondary | ICD-10-CM | POA: Diagnosis not present

## 2022-04-14 DIAGNOSIS — R9431 Abnormal electrocardiogram [ECG] [EKG]: Secondary | ICD-10-CM | POA: Diagnosis not present

## 2022-04-14 DIAGNOSIS — G9339 Other post infection and related fatigue syndromes: Secondary | ICD-10-CM | POA: Diagnosis not present

## 2022-04-15 DIAGNOSIS — A084 Viral intestinal infection, unspecified: Secondary | ICD-10-CM | POA: Diagnosis not present

## 2022-04-15 DIAGNOSIS — G9339 Other post infection and related fatigue syndromes: Secondary | ICD-10-CM | POA: Diagnosis not present

## 2022-04-15 DIAGNOSIS — R9431 Abnormal electrocardiogram [ECG] [EKG]: Secondary | ICD-10-CM | POA: Diagnosis not present

## 2022-04-15 DIAGNOSIS — I10 Essential (primary) hypertension: Secondary | ICD-10-CM | POA: Diagnosis not present

## 2022-04-23 ENCOUNTER — Telehealth: Payer: Self-pay | Admitting: Cardiology

## 2022-04-23 NOTE — Telephone Encounter (Signed)
Pt requesting an appointment as she has episodes of dizziness and shortness of breath. Denies sx at present as they come and go.

## 2022-04-23 NOTE — Telephone Encounter (Signed)
Pt c/o BP issue: STAT if pt c/o blurred vision, one-sided weakness or slurred speech  1. What are your last 5 BP readings?  89/60 (estimate)  2. Are you having any other symptoms (ex. Dizziness, headache, blurred vision, passed out)?  Back pain between shoulder blades for a few days, on 9/26 and 9/27. No CP since then. No nitro taken.  Dizziness   3. What is your BP issue?   Patient states on 9/26 and 9/27 her BP dropped to the 80'6/60's--PCP advised her to follow up with cardiology. Patient states she also had back pain around that time. She states the symptoms only lasted a few days and her BP has been back to normal since then, although she hasn't taken it today. Patient mentions this morning she had a brief episode of dizziness and became mildly faint.

## 2022-04-29 NOTE — Progress Notes (Signed)
Cardiology Office Note:    Date:  04/30/2022   ID:  Rachel Mata, DOB 1945-10-08, MRN [id number]  PCP:  Nicoletta Dress, MD  Cardiologist:  Shirlee More, MD    Referring MD: Nicoletta Dress, MD    ASSESSMENT:    1. Hypotension due to drugs   2. Primary hypertension   3. Type 2 diabetes mellitus with peripheral neuropathy (HCC)    PLAN:    In order of problems listed above:  She has hypotension in the context of diabetes and peripheral neuropathy.  I think she needs to stop her antihypertensive agents purchase a validated blood pressure device good technique trend her blood pressures sitting and standing for 2 weeks and make a decision if she requires antihypertensive treatment at this time.  I also asked her to start using compression hose knee-high to blunt this drop in blood pressure when she shifts posture. No longer requires antihypertensive agents Contributes to her hypotension orthostatic hypotension   Next appointment: I will plan to see her as needed and I asked her in 1 month to follow-up with her PCP in the office regarding ongoing antihypertensive therapy   Medication Adjustments/Labs and Tests Ordered: Current medicines are reviewed at length with the patient today.  Concerns regarding medicines are outlined above.  No orders of the defined types were placed in this encounter.  No orders of the defined types were placed in this encounter.   Chief Complaint  Patient presents with   Shortness of Breath   Dizziness    History of Present Illness:    Rachel Mata is a 76 y.o. female with a hx of hypertension type 2 diabetes mellitus hyperlipidemia coronary artery calcium score of 0 normal coronary arteriography by CTA 02/07/2019 to 03/12/2021 last seen.  Her echocardiogram 10/08/2017 showed normal left ventricular size ejection fraction and did not have findings of diastolic heart failure.  Compliance with diet, lifestyle and medications: Yes  She was seen  in the ED twice for hypotension attributed to dehydration but she had no fluid losses.  She recovered IV fluids and discharged on the same medications. Although it says in the ED note to follow-up with her family doctor she tells me Dr. Elenore Rota told her to come and see me Fortunately although she has a standing blood pressure in the 90s she is asymptomatic She has type 2 diabetes and has neuropathy I have asked her to stop her ARB thiazide diuretic purchase a validated blood pressure cuff and record her blood pressure daily sitting and standing drop a list off in 2 weeks and follow-up with her PCP. She will start using knee-high 20 to 30 mm compression hose.  She has had no cardiovascular symptoms of edema shortness of breath chest pain palpitation or syncope. Past Medical History:  Diagnosis Date   Acute kidney injury ([place]) 01/26/2021   Anemia    Chest pain 01/26/2021   Colon polyps 10/17/2017   Depression 10/17/2017   Diabetes mellitus without complication ([place]) 7510   diet controlled   Dyslipidemia 01/26/2021   Edema of both legs 10/20/2017   Essential tremor 2019   Gastritis 10/17/2017   GERD (gastroesophageal reflux disease) 10/17/2017   Hiatal hernia 10/17/2017   Hypercalcemia 01/26/2021   Hyperlipidemia    Hypertension    Insulin pump in place 10/25/2017   Leg swelling 09/04/2018   Luetscher's syndrome 01/26/2021   Numbness in feet 10/20/2017   Osteoarthritis 10/17/2017   Paraesophageal hernia 01/26/2021   Peripheral neuropathy 10/17/2017  Peripheral vascular disease ([place])    PVD (peripheral vascular disease) ([place]) 10/17/2017   Rectocele 01/26/2021   Septic olecranon bursitis 10/17/2017   Septic olecranon bursitis of left elbow 01/26/2021   Shortness of breath 01/26/2021   Skin cancer 10/17/2017   Sleep apnea 2010   does not use cpap   Sleep apnea, obstructive 10/17/2017   Stress incontinence of urine 08/09/2017   Subacute vulvitis 08/09/2017   Syncope 10/17/2017   Thyroid nodule incidentally noted on  imaging study 01/26/2021   Type 2 diabetes mellitus with peripheral neuropathy ([place]) 10/25/2017    Past Surgical History:  Procedure Laterality Date   ABDOMINAL HYSTERECTOMY     BLADDER SURGERY     CHOLECYSTECTOMY     COLONOSCOPY  05/28/2013   No polyps found. There was a spasm and muscular atrophy in the sigmoid colon. The cecum could not be identified from the distance but was not intubated. The patient had an increased pain during the procedure and had mild vagal reaction.    ESOPHAGOGASTRODUODENOSCOPY  04/28/2012   Esophageal spasm. Bile reflux gastritis. Medium hiatal hernia.    PARAESOPHAGEAL HERNIA REPAIR     RECTOCELE REPAIR     UPPER GASTROINTESTINAL ENDOSCOPY     UPPER GASTROINTESTINAL ENDOSCOPY  08/19/2020   Dr Signe Colt    Current Medications: Current Meds  Medication Sig   b complex vitamins capsule Take 1 capsule by mouth daily.   losartan-hydrochlorothiazide (HYZAAR) 100-25 MG tablet Take 1 tablet by mouth daily.   simvastatin (ZOCOR) 20 MG tablet Take 20 mg by mouth daily.   venlafaxine XR (EFFEXOR-XR) 150 MG 24 hr capsule Take 150 mg by mouth daily with breakfast.     Allergies:   Penicillin g   Social History   Socioeconomic History   Marital status: Married    Spouse name: Not on file   Number of children: Not on file   Years of education: Not on file   Highest education level: Not on file  Occupational History   Not on file  Tobacco Use   Smoking status: Never   Smokeless tobacco: Never  Vaping Use   Vaping Use: Never used  Substance and Sexual Activity   Alcohol use: Yes    Comment: occasional wine   Drug use: Never   Sexual activity: Not on file  Other Topics Concern   Not on file  Social History Narrative   Not on file   Social Determinants of Health   Financial Resource Strain: Not on file  Food Insecurity: Not on file  Transportation Needs: Not on file  Physical Activity: Not on file  Stress: Not on file  Social Connections: Not  on file     Family History: The patient's family history includes Asthma in her mother; COPD in her mother; Colon polyps in her sister; Diabetes in her paternal grandfather and paternal grandmother; Emphysema in her mother; Heart attack in her father, maternal grandfather, and maternal grandmother; Heart disease in her father; Hypercholesterolemia in her sister; Prostate cancer in her father. There is no history of Colon cancer, Esophageal cancer, Stomach cancer, or Rectal cancer. ROS:   Please see the history of present illness.    All other systems reviewed and are negative.  EKGs/Labs/Other Studies Reviewed:    The following studies were reviewed today:  EKG:  EKG ordered today and personally reviewed.  The ekg ordered today demonstrates sinus rhythm late transition in the precordial leads  Recent Labs: 06/24/2021: ALT 23; BUN 25; Creatinine,  Ser 0.89; Hemoglobin 14.1; Platelets 209; Potassium 3.0; Sodium 136  Recent Lipid Panel No results found for: "CHOL", "TRIG", "HDL", "CHOLHDL", "VLDL", "LDLCALC", "LDLDIRECT"  Physical Exam:    VS:  BP 100/70 (BP Location: Right Arm, Patient Position: Sitting, Cuff Size: Normal)   Pulse 80   Ht '5\' 3"'$  (1.6 m)   Wt 164 lb (74.4 kg)   SpO2 95%   BMI 29.05 kg/m     Wt Readings from Last 3 Encounters:  04/30/22 164 lb (74.4 kg)  07/03/21 169 lb 4 oz (76.8 kg)  06/24/21 170 lb (77.1 kg)    Her standing blood pressure office was 94/80 GEN:  Well nourished, well developed in no acute distress HEENT: Normal NECK: No JVD; No carotid bruits LYMPHATICS: No lymphadenopathy CARDIAC: RRR, no murmurs, rubs, gallops RESPIRATORY:  Clear to auscultation without rales, wheezing or rhonchi  ABDOMEN: Soft, non-tender, non-distended MUSCULOSKELETAL:  No edema; No deformity  SKIN: Warm and dry NEUROLOGIC:  Alert and oriented x 3 PSYCHIATRIC:  Normal affect    Signed, Shirlee More, MD  04/30/2022 10:23 AM    [place]

## 2022-04-30 ENCOUNTER — Encounter: Payer: Self-pay | Admitting: Cardiology

## 2022-04-30 ENCOUNTER — Ambulatory Visit: Payer: Medicare Other | Attending: Cardiology | Admitting: Cardiology

## 2022-04-30 VITALS — BP 100/70 | HR 80 | Ht 63.0 in | Wt 164.0 lb

## 2022-04-30 DIAGNOSIS — I10 Essential (primary) hypertension: Secondary | ICD-10-CM | POA: Diagnosis not present

## 2022-04-30 DIAGNOSIS — E1142 Type 2 diabetes mellitus with diabetic polyneuropathy: Secondary | ICD-10-CM | POA: Insufficient documentation

## 2022-04-30 DIAGNOSIS — I952 Hypotension due to drugs: Secondary | ICD-10-CM | POA: Diagnosis not present

## 2022-04-30 NOTE — Patient Instructions (Addendum)
Medication Instructions:  Your physician recommends that you continue on your current medications as directed. Please refer to the Current Medication list given to you today.  *If you need a refill on your cardiac medications before your next appointment, please call your pharmacy*   Lab Work: None If you have labs (blood work) drawn today and your tests are completely normal, you will receive your results only by: [place] (if you have MyChart) OR A paper copy in the mail If you have any lab test that is abnormal or we need to change your treatment, we will call you to review the results.   Testing/Procedures: None   Follow-Up: At [facility], you and your health needs are our priority.  As part of our continuing mission to provide you with exceptional heart care, we have created designated Provider Care Teams.  These Care Teams include your primary Cardiologist (physician) and Advanced Practice Providers (APPs -  Physician Assistants and Nurse Practitioners) who all work together to provide you with the care you need, when you need it.  [facility]  Tips to measure your blood pressure correctly  To determine whether you have hypertension, a medical professional will take a blood pressure reading. How you prepare for the test, the position of your arm, and other factors can change a blood pressure reading by 10% or more. That could be enough to hide high blood pressure, start you on a drug you don't really need, or lead your doctor to incorrectly adjust your medications. National and international guidelines offer specific instructions for measuring blood pressure. If a doctor, nurse, or medical assistant isn't doing it right, don't hesitate to ask him or her to get with the guidelines. Here's what you can do to ensure a correct reading:  Don't drink a caffeinated beverage or smoke during the 30 minutes before the test.  Sit quietly for five minutes before the test  begins.  During the measurement, sit in a chair with your feet on the floor and your arm supported so your elbow is at about heart level.  The inflatable part of the cuff should completely cover at least 80% of your upper arm, and the cuff should be placed on bare skin, not over a shirt.  Don't talk during the measurement.  Have your blood pressure measured twice, with a brief break in between. If the readings are different by 5 points or more, have it done a third time. There are times to break these rules. If you sometimes feel lightheaded when getting out of bed in the morning or when you stand after sitting, you should have your blood pressure checked while seated and then while standing to see if it falls from one position to the next. Because blood pressure varies throughout the day, your doctor will rarely diagnose hypertension on the basis of a single reading. Instead, he or she will want to confirm the measurements on at least two occasions, usually within a few weeks of one another. The exception to this rule is if you have a blood pressure reading of 180/110 mm Hg or higher. A result this high usually calls for prompt treatment. It's also a good idea to have your blood pressure measured in both arms at least once, since the reading in one arm (usually the right) may be higher than that in the left. A 2014 study in The American Journal of Medicine of nearly 3,400 people found average arm- to-arm differences in systolic blood pressure of  about 5 points. The higher number should be used to make treatment decisions. In 2017, new guidelines from the [employer], the [employer] of Cardiology, and nine other health organizations lowered the diagnosis of high blood pressure to 130/80 mm Hg or higher for all adults. The guidelines also redefined the various blood pressure categories to now include normal, elevated, Stage 1 hypertension, Stage 2 hypertension, and hypertensive crisis  (see "Blood pressure categories"). Blood pressure categories  Blood pressure category SYSTOLIC (upper number)  DIASTOLIC (lower number)  Normal Less than 120 mm Hg and Less than 80 mm Hg  Elevated 120-129 mm Hg and Less than 80 mm Hg  High blood pressure: Stage 1 hypertension 130-139 mm Hg or 80-89 mm Hg  High blood pressure: Stage 2 hypertension 140 mm Hg or higher or 90 mm Hg or higher  Hypertensive crisis (consult your doctor immediately) Higher than 180 mm Hg and/or Higher than 120 mm Hg  Source: American Heart Association and American Stroke Association. For more on getting your blood pressure under control, buy Controlling Your Blood Pressure, a Special Health Report from [facility].  We recommend signing up for the patient portal called "MyChart".  Sign up information is provided on this After Visit Summary.  MyChart is used to connect with patients for Virtual Visits (Telemedicine).  Patients are able to view lab/test results, encounter notes, upcoming appointments, etc.  Non-urgent messages can be sent to your provider as well.   To learn more about what you can do with MyChart, go to [email or web address].    Your next appointment:   Follow up as needed The format for your next appointment:   In Person  Provider:   Shirlee More, MD    Other Instructions Get a new [employer] blood pressure device and record blood pressure daily both sitting and standing  Knee high 20 -30 mm compression stockings  Drop off a list of blood pressures in 2 weeks.  Important Information About Sugar

## 2022-05-18 ENCOUNTER — Telehealth: Payer: Self-pay

## 2022-05-18 NOTE — Telephone Encounter (Signed)
Dr Bettina Gavia reviewed patient home blood pressure log , per Dr Bettina Gavia I reviewed her blood pressure log good results stay off her blood pressure medications. Patient informed of Dr Joya Gaskins recommendations. Patient voices understanding and agreed. Patient thanked me for calling states that she will continue to follow up with her pcp and will call cardiology if needed.

## 2022-05-26 DIAGNOSIS — E538 Deficiency of other specified B group vitamins: Secondary | ICD-10-CM | POA: Diagnosis not present

## 2022-05-26 DIAGNOSIS — H40013 Open angle with borderline findings, low risk, bilateral: Secondary | ICD-10-CM | POA: Diagnosis not present

## 2022-05-26 DIAGNOSIS — R413 Other amnesia: Secondary | ICD-10-CM | POA: Diagnosis not present

## 2022-05-26 DIAGNOSIS — E559 Vitamin D deficiency, unspecified: Secondary | ICD-10-CM | POA: Diagnosis not present

## 2022-06-12 DIAGNOSIS — J988 Other specified respiratory disorders: Secondary | ICD-10-CM | POA: Diagnosis not present

## 2022-06-12 DIAGNOSIS — R051 Acute cough: Secondary | ICD-10-CM | POA: Diagnosis not present

## 2022-06-12 DIAGNOSIS — R059 Cough, unspecified: Secondary | ICD-10-CM | POA: Diagnosis not present

## 2022-06-12 DIAGNOSIS — K449 Diaphragmatic hernia without obstruction or gangrene: Secondary | ICD-10-CM | POA: Diagnosis not present

## 2022-06-14 DIAGNOSIS — J208 Acute bronchitis due to other specified organisms: Secondary | ICD-10-CM | POA: Diagnosis not present

## 2022-06-17 DIAGNOSIS — R0602 Shortness of breath: Secondary | ICD-10-CM | POA: Diagnosis not present

## 2022-06-17 DIAGNOSIS — R059 Cough, unspecified: Secondary | ICD-10-CM | POA: Diagnosis not present

## 2022-06-17 DIAGNOSIS — Z20822 Contact with and (suspected) exposure to covid-19: Secondary | ICD-10-CM | POA: Diagnosis not present

## 2022-06-17 DIAGNOSIS — R053 Chronic cough: Secondary | ICD-10-CM | POA: Diagnosis not present

## 2022-06-17 DIAGNOSIS — J208 Acute bronchitis due to other specified organisms: Secondary | ICD-10-CM | POA: Diagnosis not present

## 2022-06-23 DIAGNOSIS — R413 Other amnesia: Secondary | ICD-10-CM | POA: Diagnosis not present

## 2022-06-23 DIAGNOSIS — J210 Acute bronchiolitis due to respiratory syncytial virus: Secondary | ICD-10-CM | POA: Diagnosis not present

## 2022-06-28 DIAGNOSIS — W06XXXA Fall from bed, initial encounter: Secondary | ICD-10-CM | POA: Diagnosis not present

## 2022-06-28 DIAGNOSIS — M25512 Pain in left shoulder: Secondary | ICD-10-CM | POA: Diagnosis not present

## 2022-06-28 DIAGNOSIS — G8911 Acute pain due to trauma: Secondary | ICD-10-CM | POA: Diagnosis not present

## 2022-06-28 DIAGNOSIS — M545 Low back pain, unspecified: Secondary | ICD-10-CM | POA: Diagnosis not present

## 2022-06-30 ENCOUNTER — Ambulatory Visit: Admitting: Psychiatry

## 2022-07-13 DIAGNOSIS — I10 Essential (primary) hypertension: Secondary | ICD-10-CM | POA: Diagnosis not present

## 2022-07-23 DIAGNOSIS — R413 Other amnesia: Secondary | ICD-10-CM | POA: Diagnosis not present

## 2022-07-26 DIAGNOSIS — R413 Other amnesia: Secondary | ICD-10-CM | POA: Diagnosis not present

## 2022-07-26 DIAGNOSIS — I10 Essential (primary) hypertension: Secondary | ICD-10-CM | POA: Diagnosis not present

## 2022-07-26 DIAGNOSIS — G8929 Other chronic pain: Secondary | ICD-10-CM | POA: Diagnosis not present

## 2022-07-26 DIAGNOSIS — M25512 Pain in left shoulder: Secondary | ICD-10-CM | POA: Diagnosis not present

## 2022-07-26 DIAGNOSIS — F5104 Psychophysiologic insomnia: Secondary | ICD-10-CM | POA: Diagnosis not present

## 2022-07-26 DIAGNOSIS — M25511 Pain in right shoulder: Secondary | ICD-10-CM | POA: Diagnosis not present

## 2022-08-04 ENCOUNTER — Ambulatory Visit: Payer: Medicare Other | Admitting: Psychiatry

## 2022-08-06 DIAGNOSIS — M25512 Pain in left shoulder: Secondary | ICD-10-CM | POA: Diagnosis not present

## 2022-08-06 DIAGNOSIS — M7542 Impingement syndrome of left shoulder: Secondary | ICD-10-CM | POA: Diagnosis not present

## 2022-10-04 DIAGNOSIS — F5104 Psychophysiologic insomnia: Secondary | ICD-10-CM | POA: Diagnosis not present

## 2022-10-04 DIAGNOSIS — F418 Other specified anxiety disorders: Secondary | ICD-10-CM | POA: Diagnosis not present

## 2022-10-04 DIAGNOSIS — Z9181 History of falling: Secondary | ICD-10-CM | POA: Diagnosis not present

## 2022-10-04 DIAGNOSIS — G250 Essential tremor: Secondary | ICD-10-CM | POA: Diagnosis not present

## 2022-10-04 DIAGNOSIS — Z139 Encounter for screening, unspecified: Secondary | ICD-10-CM | POA: Diagnosis not present

## 2022-10-04 DIAGNOSIS — Z1331 Encounter for screening for depression: Secondary | ICD-10-CM | POA: Diagnosis not present

## 2022-10-04 DIAGNOSIS — E1142 Type 2 diabetes mellitus with diabetic polyneuropathy: Secondary | ICD-10-CM | POA: Diagnosis not present

## 2022-10-04 DIAGNOSIS — I10 Essential (primary) hypertension: Secondary | ICD-10-CM | POA: Diagnosis not present

## 2022-10-04 DIAGNOSIS — D509 Iron deficiency anemia, unspecified: Secondary | ICD-10-CM | POA: Diagnosis not present

## 2022-10-04 DIAGNOSIS — E785 Hyperlipidemia, unspecified: Secondary | ICD-10-CM | POA: Diagnosis not present

## 2022-10-04 DIAGNOSIS — R413 Other amnesia: Secondary | ICD-10-CM | POA: Diagnosis not present

## 2022-10-04 DIAGNOSIS — M8589 Other specified disorders of bone density and structure, multiple sites: Secondary | ICD-10-CM | POA: Diagnosis not present

## 2022-10-07 DIAGNOSIS — Z1231 Encounter for screening mammogram for malignant neoplasm of breast: Secondary | ICD-10-CM | POA: Diagnosis not present

## 2022-10-26 ENCOUNTER — Telehealth: Payer: Self-pay | Admitting: Gastroenterology

## 2022-10-26 NOTE — Telephone Encounter (Signed)
Good morning Dr. Chales Abrahams,   We received a call from this patient to schedule her colonoscopy. However, patient is over the age 77. Would you want to schedule an OV prior to colonoscopy, or could I schedule it directly? Please advise.    Thank you.

## 2022-10-27 NOTE — Telephone Encounter (Signed)
Lets do OV please RG

## 2022-10-28 DIAGNOSIS — E119 Type 2 diabetes mellitus without complications: Secondary | ICD-10-CM | POA: Diagnosis not present

## 2022-10-28 DIAGNOSIS — H5203 Hypermetropia, bilateral: Secondary | ICD-10-CM | POA: Diagnosis not present

## 2022-11-09 DIAGNOSIS — Z01818 Encounter for other preprocedural examination: Secondary | ICD-10-CM | POA: Diagnosis not present

## 2022-11-09 DIAGNOSIS — H2512 Age-related nuclear cataract, left eye: Secondary | ICD-10-CM | POA: Diagnosis not present

## 2022-11-09 DIAGNOSIS — H2511 Age-related nuclear cataract, right eye: Secondary | ICD-10-CM | POA: Diagnosis not present

## 2022-11-09 DIAGNOSIS — H353131 Nonexudative age-related macular degeneration, bilateral, early dry stage: Secondary | ICD-10-CM | POA: Diagnosis not present

## 2022-11-18 DIAGNOSIS — H269 Unspecified cataract: Secondary | ICD-10-CM | POA: Diagnosis not present

## 2022-11-18 DIAGNOSIS — H2511 Age-related nuclear cataract, right eye: Secondary | ICD-10-CM | POA: Diagnosis not present

## 2022-12-02 DIAGNOSIS — H2512 Age-related nuclear cataract, left eye: Secondary | ICD-10-CM | POA: Diagnosis not present

## 2023-01-11 ENCOUNTER — Encounter: Payer: Self-pay | Admitting: Gastroenterology

## 2023-01-11 ENCOUNTER — Ambulatory Visit (INDEPENDENT_AMBULATORY_CARE_PROVIDER_SITE_OTHER): Payer: Medicare Other | Admitting: Gastroenterology

## 2023-01-11 VITALS — BP 134/80 | HR 61 | Ht 63.0 in | Wt 150.0 lb

## 2023-01-11 DIAGNOSIS — Z860100 Personal history of colon polyps, unspecified: Secondary | ICD-10-CM

## 2023-01-11 DIAGNOSIS — K5909 Other constipation: Secondary | ICD-10-CM | POA: Diagnosis not present

## 2023-01-11 MED ORDER — ONDANSETRON 4 MG PO TBDP: 4.0000 mg | ORAL_TABLET | Freq: Three times a day (TID) | ORAL | 0 refills | Status: AC | PRN

## 2023-01-11 MED ORDER — PANTOPRAZOLE SODIUM 40 MG PO TBEC: 40.0000 mg | DELAYED_RELEASE_TABLET | Freq: Every day | ORAL | 3 refills | Status: DC

## 2023-01-11 MED ORDER — LINACLOTIDE 145 MCG PO CAPS: 145.0000 ug | ORAL_CAPSULE | Freq: Every day | ORAL | 6 refills | Status: AC

## 2023-01-11 MED ORDER — NA SULFATE-K SULFATE-MG SULF 17.5-3.13-1.6 GM/177ML PO SOLN: 1.0000 | Freq: Once | ORAL | 0 refills | Status: AC

## 2023-01-11 NOTE — Progress Notes (Signed)
Chief Complaint:   Referring Provider:  Paulina Fusi, MD      ASSESSMENT AND PLAN;   #1. H/O polyps/FH polyps  #2. N/V (resolved)  with large HH. Prev failed paraesophageal hernia repair. Neg CT chest/A/P 06/24/2021 for volvulus.  #2. H/O eso dysphagia s/p EGD with dil 17 mm savory with resolution of dysphagia (Dr. Jennye Boroughs) 08/19/2020. EGD- the eso was tortuous, gastric polyps (Bx-fundic gland polyps), large hiatal hernia, previous H/O failed paraesophageal hernia repair.  #3. Chronic constipation-much better  Plan: -Colon with 2 day prep (peds scope) -Linzess po QD #30 (samples given) -Miralax 17g po every day prn -Continue protonix 40 QD -zofran 4mg  OTC Q8hrs prn #20. -Increase water intake. -D/W pt and her husband.    Discussed risks & benefits of colonoscopy. Risks including rare perforation req laparotomy, bleeding after bx/polypectomy req blood transfusion, rarely missing neoplasms, risks of anesthesia/sedation, rare risk of damage to internal organs. Benefits outweigh the risks. Patient agrees to proceed. All the questions were answered. Pt consents to proceed.  She does understand the risks of colonoscopy increases with increasing age.    HPI:    Rachel Mata is a 77 y.o. female  With HTN, PVD, diet-controlled DM with peripheral neuropathy, hiatal hernia, HLD Accompanied by her husband  For FU  Constipation much better.  Taking MiraLAX only on as needed basis.  She takes Linzess once per month on as-needed basis.    Wants get colon done as her sister had larger polyps.  She was due for colonoscopy 12/2021.   No further N/V.  She does take Zofran on as-needed basis.  Also has been compliant with Protonix.  No weight loss.     Of note-ED visit for N/V 06/24/2021. CTA chest/Abdo/pelvis with contrast was neg for gastric volvulus.  It did show thyroid nodules, DJD, constipation, mod HH. (Have given copy of CT to patient).  She had normal labs.   Nexium switched to Protonix with good results.   She has increased water intake.  Previous GI procedures: Colonoscopy 01/01/2019 -Colonic polyps status post polypectomy. Bx-tubular adenomas. Rpt 3 yrs -Mild sigmoid diverticulosis. Highly tortuous colon. " Fixed sigmoid" -Non-bleeding external internal hemorrhoids.  Colonoscopy November 2014: Difficult procedure, peds scope. Dr. Charm Barges  EGD with Dil 08/19/2020 Dr. 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  CT AP with contrast 10/2020 1. No acute findings within the abdomen or pelvis. 2. Moderate to large hiatal hernia. 3. Bilateral kidney cysts. 4. Aortic atherosclerosis.   Wt Readings from Last 3 Encounters:  01/11/23 150 lb (68 kg)  04/30/22 164 lb (74.4 kg)  07/03/21 169 lb 4 oz (76.8 kg)    Past Medical History:  Diagnosis Date   Acute kidney injury (HCC) 01/26/2021   Anemia    Chest pain 01/26/2021   Colon polyps 10/17/2017   Depression 10/17/2017   Diabetes mellitus without complication (HCC) 2017   diet controlled   Dyslipidemia 01/26/2021   Edema of both legs 10/20/2017   Essential tremor 2019   Gastritis 10/17/2017   GERD (gastroesophageal reflux disease) 10/17/2017   Hiatal hernia 10/17/2017   Hypercalcemia 01/26/2021   Hyperlipidemia    Hypertension    Insulin pump in place 10/25/2017   Leg swelling 09/04/2018   Luetscher's syndrome 01/26/2021   Numbness in feet 10/20/2017   Osteoarthritis 10/17/2017   Paraesophageal hernia 01/26/2021   Peripheral neuropathy 10/17/2017   Peripheral vascular  disease (HCC)    PVD (peripheral vascular disease) (HCC) 10/17/2017   Rectocele 01/26/2021   Septic olecranon bursitis 10/17/2017   Septic olecranon bursitis of left elbow 01/26/2021   Shortness of breath 01/26/2021   Skin cancer 10/17/2017   Sleep apnea 2010   does not use cpap   Sleep apnea, obstructive 10/17/2017   Stress  incontinence of urine 08/09/2017   Subacute vulvitis 08/09/2017   Syncope 10/17/2017   Thyroid nodule incidentally noted on imaging study 01/26/2021   Type 2 diabetes mellitus with peripheral neuropathy (HCC) 10/25/2017    Past Surgical History:  Procedure Laterality Date   ABDOMINAL HYSTERECTOMY     BLADDER SURGERY     CHOLECYSTECTOMY     COLONOSCOPY  05/28/2013   No polyps found. There was a spasm and muscular atrophy in the sigmoid colon. The cecum could not be identified from the distance but was not intubated. The patient had an increased pain during the procedure and had mild vagal reaction.    ESOPHAGOGASTRODUODENOSCOPY  04/28/2012   Esophageal spasm. Bile reflux gastritis. Medium hiatal hernia.    PARAESOPHAGEAL HERNIA REPAIR     RECTOCELE REPAIR     UPPER GASTROINTESTINAL ENDOSCOPY     UPPER GASTROINTESTINAL ENDOSCOPY  08/19/2020   Dr Meissenheimer    Family History  Problem Relation Age of Onset   Emphysema Mother    Asthma Mother    COPD Mother    Prostate cancer Father    Heart attack Father    Heart disease Father    Hypercholesterolemia Sister    Colon polyps Sister    Heart attack Maternal Grandmother    Heart attack Maternal Grandfather    Diabetes Paternal Grandmother    Diabetes Paternal Grandfather    Colon cancer Neg Hx    Esophageal cancer Neg Hx    Stomach cancer Neg Hx    Rectal cancer Neg Hx     Social History   Tobacco Use   Smoking status: Never   Smokeless tobacco: Never  Vaping Use   Vaping Use: Never used  Substance Use Topics   Alcohol use: Yes    Comment: occasional wine   Drug use: Never    Current Outpatient Medications  Medication Sig Dispense Refill   b complex vitamins capsule Take 1 capsule by mouth daily.     donepezil (ARICEPT) 5 MG tablet Take 1 tablet by mouth at bedtime, for memory     linaCLOtide (LINZESS PO) Take by mouth.     losartan-hydrochlorothiazide (HYZAAR) 100-25 MG tablet Take 1 tablet by mouth daily.      simvastatin (ZOCOR) 20 MG tablet Take 20 mg by mouth daily.     traMADol (ULTRAM) 50 MG tablet Take by mouth every 6 (six) hours as needed.     venlafaxine XR (EFFEXOR-XR) 150 MG 24 hr capsule Take 150 mg by mouth daily with breakfast.     No current facility-administered medications for this visit.    Allergies  Allergen Reactions   Penicillin G Other (See Comments)    Pt states it doesn't work    Review of Systems:  neg     Physical Exam:    BP 134/80   Pulse 61   Ht 5\' 3"  (1.6 m)   Wt 150 lb (68 kg)   BMI 26.57 kg/m  Filed Weights   01/11/23 0826  Weight: 150 lb (68 kg)   Constitutional: On wheelchair Psychiatric: Normal mood and affect. Behavior is normal. HEENT: Pupils normal.  Conjunctivae are normal. No scleral icterus. Abdominal: Soft, mildly distended. nontender. Bowel sounds active throughout. There are no masses palpable. No hepatomegaly. Rectal:  defered Neurological: Alert and oriented to person place and time. Skin: Skin is warm and dry. No rashes noted.   Edman Circle, MD 01/11/2023, 8:52 AM  Cc: Paulina Fusi, MD

## 2023-01-11 NOTE — Patient Instructions (Addendum)
_______________________________________________________  If your blood pressure at your visit was 140/90 or greater, please contact your primary care physician to follow up on this.  _______________________________________________________  If you are age 77 or older, your body mass index should be between 23-30. Your Body mass index is 26.57 kg/m. If this is out of the aforementioned range listed, please consider follow up with your Primary Care Provider.  If you are age 54 or younger, your body mass index should be between 19-25. Your Body mass index is 26.57 kg/m. If this is out of the aformentioned range listed, please consider follow up with your Primary Care Provider.   ________________________________________________________  The [facility] providers would like to encourage you to use [facility] to communicate with providers for non-urgent requests or questions.  Due to long hold times on the telephone, sending your provider a message by [facility] may be a faster and more efficient way to get a response.  Please allow 48 business hours for a response.  Please remember that this is for non-urgent requests.  _______________________________________________________  Bonita Quin have been scheduled for a colonoscopy. Please follow written instructions given to you at your visit today.  Please pick up your prep supplies at the pharmacy within the next 1-3 days. If you use inhalers (even only as needed), please bring them with you on the day of your procedure.  Two days before your procedure: Mix 3 packs (or capfuls) of Miralax in 48 ounces of clear liquid and drink at 6pm.  We have sent the following medications to your pharmacy for you to pick up at your convenience: Zofran Protonix Suprep Linzess  Please purchase the following medications over the counter and take as directed: Miralax 17g daily prn  We have given you samples of the following medication to take: Linzess  Increase water  intake  Thank you,  Dr. Lynann Bologna

## 2023-01-25 ENCOUNTER — Encounter: Payer: Self-pay | Admitting: Gastroenterology

## 2023-01-25 ENCOUNTER — Ambulatory Visit (AMBULATORY_SURGERY_CENTER): Payer: Medicare Other | Admitting: Gastroenterology

## 2023-01-25 VITALS — BP 155/83 | HR 69 | Temp 98.2°F | Resp 14 | Ht 63.0 in | Wt 150.0 lb

## 2023-01-25 DIAGNOSIS — Z09 Encounter for follow-up examination after completed treatment for conditions other than malignant neoplasm: Secondary | ICD-10-CM

## 2023-01-25 DIAGNOSIS — Z86010 Personal history of colonic polyps: Secondary | ICD-10-CM

## 2023-01-25 DIAGNOSIS — K5909 Other constipation: Secondary | ICD-10-CM

## 2023-01-25 DIAGNOSIS — I10 Essential (primary) hypertension: Secondary | ICD-10-CM | POA: Diagnosis not present

## 2023-01-25 DIAGNOSIS — E119 Type 2 diabetes mellitus without complications: Secondary | ICD-10-CM | POA: Diagnosis not present

## 2023-01-25 DIAGNOSIS — E669 Obesity, unspecified: Secondary | ICD-10-CM | POA: Diagnosis not present

## 2023-01-25 MED ORDER — SODIUM CHLORIDE 0.9 % IV SOLN: 500.0000 mL | INTRAVENOUS | Status: DC

## 2023-01-25 NOTE — Progress Notes (Signed)
Chief Complaint:   Referring Provider:  Paulina Fusi, MD      ASSESSMENT AND PLAN;   #1. H/O polyps/FH polyps  #2. N/V (resolved)  with large HH. Prev failed paraesophageal hernia repair. Neg CT chest/A/P 06/24/2021 for volvulus.  #2. H/O eso dysphagia s/p EGD with dil 17 mm savory with resolution of dysphagia (Dr. Jennye Boroughs) 08/19/2020. EGD- the eso was tortuous, gastric polyps (Bx-fundic gland polyps), large hiatal hernia, previous H/O failed paraesophageal hernia repair.  #3. Chronic constipation-much better  Plan: -Colon with 2 day prep (peds scope) -Linzess po QD #30 (samples given) -Miralax 17g po every day prn -Continue protonix 40 QD -zofran 4mg  OTC Q8hrs prn #20. -Increase water intake. -D/W pt and her husband.    Discussed risks & benefits of colonoscopy. Risks including rare perforation req laparotomy, bleeding after bx/polypectomy req blood transfusion, rarely missing neoplasms, risks of anesthesia/sedation, rare risk of damage to internal organs. Benefits outweigh the risks. Patient agrees to proceed. All the questions were answered. Pt consents to proceed.  She does understand the risks of colonoscopy increases with increasing age.    HPI:    Majesti Duhn is a 77 y.o. female  With HTN, PVD, diet-controlled DM with peripheral neuropathy, hiatal hernia, HLD Accompanied by her husband  For FU  Constipation much better.  Taking MiraLAX only on as needed basis.  She takes Linzess once per month on as-needed basis.    Wants get colon done as her sister had larger polyps.  She was due for colonoscopy 12/2021.   No further N/V.  She does take Zofran on as-needed basis.  Also has been compliant with Protonix.  No weight loss.     Of note-ED visit for N/V 06/24/2021. CTA chest/Abdo/pelvis with contrast was neg for gastric volvulus.  It did show thyroid nodules, DJD, constipation, mod HH. (Have given copy of CT to patient).  She had normal labs.   Nexium switched to Protonix with good results.   She has increased water intake.  Previous GI procedures: Colonoscopy 01/01/2019 -Colonic polyps status post polypectomy. Bx-tubular adenomas. Rpt 3 yrs -Mild sigmoid diverticulosis. Highly tortuous colon. " Fixed sigmoid" -Non-bleeding external internal hemorrhoids.  Colonoscopy November 2014: Difficult procedure, peds scope. Dr. Charm Barges  EGD with Dil 08/19/2020 Dr. 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  CT AP with contrast 10/2020 1. No acute findings within the abdomen or pelvis. 2. Moderate to large hiatal hernia. 3. Bilateral kidney cysts. 4. Aortic atherosclerosis.   Wt Readings from Last 3 Encounters:  01/25/23 150 lb (68 kg)  01/11/23 150 lb (68 kg)  04/30/22 164 lb (74.4 kg)    Past Medical History:  Diagnosis Date   Acute kidney injury (HCC) 01/26/2021   Anemia    Chest pain 01/26/2021   Colon polyps 10/17/2017   Depression 10/17/2017   Diabetes mellitus without complication (HCC) 2017   diet controlled   Dyslipidemia 01/26/2021   Edema of both legs 10/20/2017   Essential tremor 2019   Gastritis 10/17/2017   GERD (gastroesophageal reflux disease) 10/17/2017   Hiatal hernia 10/17/2017   Hypercalcemia 01/26/2021   Hyperlipidemia    Hypertension    Leg swelling 09/04/2018   Luetscher's syndrome 01/26/2021   Numbness in feet 10/20/2017   Osteoarthritis 10/17/2017   Paraesophageal hernia 01/26/2021   Peripheral neuropathy 10/17/2017   Peripheral vascular disease (HCC)    PVD (peripheral vascular disease) (  HCC) 10/17/2017   Rectocele 01/26/2021   Septic olecranon bursitis of left elbow 01/26/2021   Shortness of breath 01/26/2021   Skin cancer 10/17/2017   Sleep apnea 2010   does not use cpap   Stress incontinence of urine 08/09/2017   Subacute vulvitis 08/09/2017   Syncope 10/17/2017    Thyroid nodule incidentally noted on imaging study 01/26/2021   Type 2 diabetes mellitus with peripheral neuropathy (HCC) 10/25/2017    Past Surgical History:  Procedure Laterality Date   ABDOMINAL HYSTERECTOMY     BLADDER SURGERY     CHOLECYSTECTOMY     COLONOSCOPY  05/28/2013   No polyps found. There was a spasm and muscular atrophy in the sigmoid colon. The cecum could not be identified from the distance but was not intubated. The patient had an increased pain during the procedure and had mild vagal reaction.    ESOPHAGOGASTRODUODENOSCOPY  04/28/2012   Esophageal spasm. Bile reflux gastritis. Medium hiatal hernia.    PARAESOPHAGEAL HERNIA REPAIR     RECTOCELE REPAIR     UPPER GASTROINTESTINAL ENDOSCOPY     UPPER GASTROINTESTINAL ENDOSCOPY  08/19/2020   Dr Meissenheimer    Family History  Problem Relation Age of Onset   Emphysema Mother    Asthma Mother    COPD Mother    Prostate cancer Father    Heart attack Father    Heart disease Father    Hypercholesterolemia Sister    Colon polyps Sister    Heart attack Maternal Grandmother    Heart attack Maternal Grandfather    Diabetes Paternal Grandmother    Diabetes Paternal Grandfather    Colon cancer Neg Hx    Esophageal cancer Neg Hx    Stomach cancer Neg Hx    Rectal cancer Neg Hx     Social History   Tobacco Use   Smoking status: Never   Smokeless tobacco: Never  Vaping Use   Vaping Use: Never used  Substance Use Topics   Alcohol use: Yes    Comment: occasional wine   Drug use: Never    Current Outpatient Medications  Medication Sig Dispense Refill   b complex vitamins capsule Take 1 capsule by mouth daily.     donepezil (ARICEPT) 5 MG tablet Take 1 tablet by mouth at bedtime, for memory     losartan-hydrochlorothiazide (HYZAAR) 100-25 MG tablet Take 1 tablet by mouth daily.     pantoprazole (PROTONIX) 40 MG tablet Take 1 tablet (40 mg total) by mouth daily. 90 tablet 3   simvastatin (ZOCOR) 20 MG tablet  Take 20 mg by mouth daily.     traMADol (ULTRAM) 50 MG tablet Take by mouth every 6 (six) hours as needed.     venlafaxine XR (EFFEXOR-XR) 150 MG 24 hr capsule Take 150 mg by mouth daily with breakfast.     albuterol (VENTOLIN HFA) 108 (90 Base) MCG/ACT inhaler Inhale 1 puff into the lungs every 6 (six) hours as needed.     linaCLOtide (LINZESS PO) Take by mouth. (Patient not taking: Reported on 01/25/2023)     linaclotide (LINZESS) 145 MCG CAPS capsule Take 1 capsule (145 mcg total) by mouth daily before breakfast. (Patient not taking: Reported on 01/25/2023) 30 capsule 6   ondansetron (ZOFRAN-ODT) 4 MG disintegrating tablet Take 1 tablet (4 mg total) by mouth every 8 (eight) hours as needed for nausea or vomiting. 20 tablet 0   Current Facility-Administered Medications  Medication Dose Route Frequency Provider Last Rate Last Admin  0.9 %  sodium chloride infusion  500 mL Intravenous Continuous Lynann Bologna, MD        Allergies  Allergen Reactions   Penicillin G Other (See Comments)    Pt states it doesn't work    Review of Systems:  neg     Physical Exam:    BP (!) 148/95   Pulse 89   Temp 98.2 F (36.8 C)   Resp 13   Ht 5\' 3"  (1.6 m)   Wt 150 lb (68 kg)   SpO2 100%   BMI 26.57 kg/m  Filed Weights   01/25/23 1015  Weight: 150 lb (68 kg)   Constitutional: On wheelchair Psychiatric: Normal mood and affect. Behavior is normal. HEENT: Pupils normal.  Conjunctivae are normal. No scleral icterus. Abdominal: Soft, mildly distended. nontender. Bowel sounds active throughout. There are no masses palpable. No hepatomegaly. Rectal:  defered Neurological: Alert and oriented to person place and time. Skin: Skin is warm and dry. No rashes noted.   Edman Circle, MD 01/25/2023, 11:55 AM  Cc: Paulina Fusi, MD

## 2023-01-25 NOTE — Progress Notes (Signed)
Approx 1116, pt started "gulping".  Suction started. Sedation halted.  Small amount clear water/drool fluid obtained.  Pt allowed to wake up and follow commands to cough and swallow.  Zofran given (robinul had already been given.  Dr Reece Agar still in sigmoid so decided to not start sedation again until out of sigmoid.  Pt started vomitting bile colored fluid.  Suction was still being used so caught majority of it in the cannister.  Pt still alert/awake enogh to spit into yaunker.  Sedation never restarted.  Sats never dropped and BS sounded clear initially

## 2023-01-25 NOTE — Op Note (Signed)
[facility] Patient Name: Rachel Mata Procedure Date: 01/25/2023 11:05 AM MRN: [id number] Endoscopist: Lynann Bologna , MD, [id number] Age: 77 Referring MD:  Date of Birth: 1946/04/28 Gender: Female Account #: [id number] Procedure:                Colonoscopy Indications:              High risk colon cancer surveillance: Personal                            history of colonic polyps Medicines:                Monitored Anesthesia Care Procedure:                Pre-Anesthesia Assessment:                           - Prior to the procedure, a History and Physical                            was performed, and patient medications and                            allergies were reviewed. The patient's tolerance of                            previous anesthesia was also reviewed. The risks                            and benefits of the procedure and the sedation                            options and risks were discussed with the patient.                            All questions were answered, and informed consent                            was obtained. Prior Anticoagulants: The patient has                            taken no anticoagulant or antiplatelet agents. ASA                            Grade Assessment: III - A patient with severe                            systemic disease. After reviewing the risks and                            benefits, the patient was deemed in satisfactory                            condition to undergo the procedure.  After obtaining informed consent, the colonoscope                            was passed under direct vision. Throughout the                            procedure, the patient's blood pressure, pulse, and                            oxygen saturations were monitored continuously. The                            Olympus Scope SN: [id number] was introduced through                            the anus and advanced to the 2 cm  into the ileum.                            The colonoscopy was performed with moderate                            difficulty due to a tortuous colon especially due                            to "fixed sigmoid colon".. Successful completion of                            the procedure was aided by applying abdominal                            pressure. The patient tolerated the procedure well.                            The quality of the bowel preparation was good. The                            terminal ileum, ileocecal valve, appendiceal                            orifice, and rectum were photographed. Scope In: 11:12:00 AM Scope Out: 11:43:48 AM Scope Withdrawal Time: 0 hours 7 minutes 9 seconds  Total Procedure Duration: 0 hours 31 minutes 48 seconds  Findings:                 Difficult procedure d/t "fixed" sigmoid colon and                            highly redundant rest of the colon.                           A few medium-mouthed diverticula were found in the                            sigmoid colon and ascending  colon.                           Non-bleeding internal hemorrhoids were found during                            retroflexion. The hemorrhoids were small and Grade                            I (internal hemorrhoids that do not prolapse).                           The terminal ileum appeared normal.                           The exam was otherwise without abnormality on                            direct and retroflexion views. Complications:            No immediate complications. Estimated Blood Loss:     Estimated blood loss: none. Impression:               - Mild pancolonic diverticulosis                           - Non-bleeding internal hemorrhoids.                           - The examined portion of the ileum was normal.                           - The examination was otherwise normal on direct                            and retroflexion views.                            - No specimens collected. Recommendation:           - Patient has a contact number available for                            emergencies. The signs and symptoms of potential                            delayed complications were discussed with the                            patient. Return to normal activities tomorrow.                            Written discharge instructions were provided to the                            patient.                           -  Resume previous diet.                           - Recommend MiraLAX 17 g p.o. daily.                           - Continue present medications.                           - Repeat colonoscopy is not recommended for                            surveillance. Hence repeat colonoscopy only if with                            any new problems.                           - Return to GI clinic PRN. Lynann Bologna, MD 01/25/2023 11:54:31 AM This report has been signed electronically.

## 2023-01-25 NOTE — Patient Instructions (Signed)
YOU HAD AN ENDOSCOPIC PROCEDURE TODAY AT THE [facility]:   Refer to the procedure report that was given to you for any specific questions about what was found during the examination.  If the procedure report does not answer your questions, please call your gastroenterologist to clarify.  If you requested that your care partner not be given the details of your procedure findings, then the procedure report has been included in a sealed envelope for you to review at your convenience later.  **Handouts given on diverticulosis and hemorrhoids**  YOU SHOULD EXPECT: Some feelings of bloating in the abdomen. Passage of more gas than usual.  Walking can help get rid of the air that was put into your GI tract during the procedure and reduce the bloating. If you had a lower endoscopy (such as a colonoscopy or flexible sigmoidoscopy) you may notice spotting of blood in your stool or on the toilet paper. If you underwent a bowel prep for your procedure, you may not have a normal bowel movement for a few days.  Please Note:  You might notice some irritation and congestion in your nose or some drainage.  This is from the oxygen used during your procedure.  There is no need for concern and it should clear up in a day or so.  SYMPTOMS TO REPORT IMMEDIATELY:  Following lower endoscopy (colonoscopy or flexible sigmoidoscopy):  Excessive amounts of blood in the stool  Significant tenderness or worsening of abdominal pains  Swelling of the abdomen that is new, acute  Fever of 100F or higher  For urgent or emergent issues, a gastroenterologist can be reached at any hour by calling [phone]. Do not use MyChart messaging for urgent concerns.    DIET:  We do recommend a small meal at first, but then you may proceed to your regular diet.  Drink plenty of fluids but you should avoid alcoholic beverages for 24 hours.  ACTIVITY:  You should plan to take it easy for the rest of today and you should NOT  DRIVE or use heavy machinery until tomorrow (because of the sedation medicines used during the test).    FOLLOW UP: Our staff will call the number listed on your records the next business day following your procedure.  We will call around 7:15- 8:00 am to check on you and address any questions or concerns that you may have regarding the information given to you following your procedure. If we do not reach you, we will leave a message.     If any biopsies were taken you will be contacted by phone or by letter within the next 1-3 weeks.  Please call us at [phone] if you have not heard about the biopsies in 3 weeks.    SIGNATURES/CONFIDENTIALITY: You and/or your care partner have signed paperwork which will be entered into your electronic medical record.  These signatures attest to the fact that that the information above on your After Visit Summary has been reviewed and is understood.  Full responsibility of the confidentiality of this discharge information lies with you and/or your care-partner. 

## 2023-01-26 ENCOUNTER — Telehealth: Payer: Self-pay | Admitting: *Deleted

## 2023-01-26 DIAGNOSIS — M47816 Spondylosis without myelopathy or radiculopathy, lumbar region: Secondary | ICD-10-CM | POA: Diagnosis not present

## 2023-01-26 DIAGNOSIS — M25552 Pain in left hip: Secondary | ICD-10-CM | POA: Diagnosis not present

## 2023-01-26 NOTE — Telephone Encounter (Signed)
Attempted to call patient for their post-procedure follow-up call. No answer. Left voicemail.   

## 2023-01-31 DIAGNOSIS — R1032 Left lower quadrant pain: Secondary | ICD-10-CM | POA: Diagnosis not present

## 2023-01-31 DIAGNOSIS — F418 Other specified anxiety disorders: Secondary | ICD-10-CM | POA: Diagnosis not present

## 2023-02-03 DIAGNOSIS — M5136 Other intervertebral disc degeneration, lumbar region: Secondary | ICD-10-CM | POA: Diagnosis not present

## 2023-02-03 DIAGNOSIS — M47816 Spondylosis without myelopathy or radiculopathy, lumbar region: Secondary | ICD-10-CM | POA: Diagnosis not present

## 2023-02-23 DIAGNOSIS — M6281 Muscle weakness (generalized): Secondary | ICD-10-CM | POA: Diagnosis not present

## 2023-02-23 DIAGNOSIS — M545 Low back pain, unspecified: Secondary | ICD-10-CM | POA: Diagnosis not present

## 2023-02-24 DIAGNOSIS — M5116 Intervertebral disc disorders with radiculopathy, lumbar region: Secondary | ICD-10-CM | POA: Diagnosis not present

## 2023-02-24 DIAGNOSIS — M47816 Spondylosis without myelopathy or radiculopathy, lumbar region: Secondary | ICD-10-CM | POA: Diagnosis not present

## 2023-02-24 DIAGNOSIS — M5136 Other intervertebral disc degeneration, lumbar region: Secondary | ICD-10-CM | POA: Diagnosis not present

## 2023-02-24 DIAGNOSIS — M4726 Other spondylosis with radiculopathy, lumbar region: Secondary | ICD-10-CM | POA: Diagnosis not present

## 2023-02-24 DIAGNOSIS — M5117 Intervertebral disc disorders with radiculopathy, lumbosacral region: Secondary | ICD-10-CM | POA: Diagnosis not present

## 2023-02-24 DIAGNOSIS — M5115 Intervertebral disc disorders with radiculopathy, thoracolumbar region: Secondary | ICD-10-CM | POA: Diagnosis not present

## 2023-03-01 DIAGNOSIS — M545 Low back pain, unspecified: Secondary | ICD-10-CM | POA: Diagnosis not present

## 2023-03-01 DIAGNOSIS — M6281 Muscle weakness (generalized): Secondary | ICD-10-CM | POA: Diagnosis not present

## 2023-03-03 DIAGNOSIS — M47816 Spondylosis without myelopathy or radiculopathy, lumbar region: Secondary | ICD-10-CM | POA: Diagnosis not present

## 2023-03-03 DIAGNOSIS — M7918 Myalgia, other site: Secondary | ICD-10-CM | POA: Diagnosis not present

## 2023-03-07 DIAGNOSIS — M545 Low back pain, unspecified: Secondary | ICD-10-CM | POA: Diagnosis not present

## 2023-03-07 DIAGNOSIS — M6281 Muscle weakness (generalized): Secondary | ICD-10-CM | POA: Diagnosis not present

## 2023-03-22 DIAGNOSIS — M6281 Muscle weakness (generalized): Secondary | ICD-10-CM | POA: Diagnosis not present

## 2023-03-22 DIAGNOSIS — M545 Low back pain, unspecified: Secondary | ICD-10-CM | POA: Diagnosis not present

## 2023-03-24 DIAGNOSIS — M545 Low back pain, unspecified: Secondary | ICD-10-CM | POA: Diagnosis not present

## 2023-03-24 DIAGNOSIS — M6281 Muscle weakness (generalized): Secondary | ICD-10-CM | POA: Diagnosis not present

## 2023-03-31 DIAGNOSIS — M6281 Muscle weakness (generalized): Secondary | ICD-10-CM | POA: Diagnosis not present

## 2023-03-31 DIAGNOSIS — M545 Low back pain, unspecified: Secondary | ICD-10-CM | POA: Diagnosis not present

## 2023-04-07 DIAGNOSIS — M8589 Other specified disorders of bone density and structure, multiple sites: Secondary | ICD-10-CM | POA: Diagnosis not present

## 2023-04-07 DIAGNOSIS — E785 Hyperlipidemia, unspecified: Secondary | ICD-10-CM | POA: Diagnosis not present

## 2023-04-07 DIAGNOSIS — F418 Other specified anxiety disorders: Secondary | ICD-10-CM | POA: Diagnosis not present

## 2023-04-07 DIAGNOSIS — I10 Essential (primary) hypertension: Secondary | ICD-10-CM | POA: Diagnosis not present

## 2023-04-07 DIAGNOSIS — E1142 Type 2 diabetes mellitus with diabetic polyneuropathy: Secondary | ICD-10-CM | POA: Diagnosis not present

## 2023-04-07 DIAGNOSIS — G8929 Other chronic pain: Secondary | ICD-10-CM | POA: Diagnosis not present

## 2023-04-07 DIAGNOSIS — D509 Iron deficiency anemia, unspecified: Secondary | ICD-10-CM | POA: Diagnosis not present

## 2023-04-07 DIAGNOSIS — R413 Other amnesia: Secondary | ICD-10-CM | POA: Diagnosis not present

## 2023-04-07 DIAGNOSIS — G250 Essential tremor: Secondary | ICD-10-CM | POA: Diagnosis not present

## 2023-05-06 DIAGNOSIS — R233 Spontaneous ecchymoses: Secondary | ICD-10-CM | POA: Diagnosis not present

## 2023-05-06 DIAGNOSIS — L570 Actinic keratosis: Secondary | ICD-10-CM | POA: Diagnosis not present

## 2023-05-26 DIAGNOSIS — H903 Sensorineural hearing loss, bilateral: Secondary | ICD-10-CM | POA: Diagnosis not present

## 2023-06-29 DIAGNOSIS — G250 Essential tremor: Secondary | ICD-10-CM | POA: Diagnosis not present

## 2023-07-28 DIAGNOSIS — L570 Actinic keratosis: Secondary | ICD-10-CM | POA: Diagnosis not present

## 2023-07-28 DIAGNOSIS — D489 Neoplasm of uncertain behavior, unspecified: Secondary | ICD-10-CM | POA: Diagnosis not present

## 2023-07-28 DIAGNOSIS — L820 Inflamed seborrheic keratosis: Secondary | ICD-10-CM | POA: Diagnosis not present

## 2023-07-29 ENCOUNTER — Telehealth: Payer: Self-pay | Admitting: Gastroenterology

## 2023-07-29 NOTE — Telephone Encounter (Signed)
 Patient called requesting a refill for the Pantoprazole to be sent to [facility].

## 2023-08-01 MED ORDER — PANTOPRAZOLE SODIUM 40 MG PO TBEC: 40.0000 mg | DELAYED_RELEASE_TABLET | Freq: Every day | ORAL | 1 refills | Status: AC

## 2023-08-01 NOTE — Telephone Encounter (Signed)
 Done

## 2023-08-11 DIAGNOSIS — C44519 Basal cell carcinoma of skin of other part of trunk: Secondary | ICD-10-CM | POA: Diagnosis not present

## 2023-08-24 DIAGNOSIS — M25552 Pain in left hip: Secondary | ICD-10-CM | POA: Diagnosis not present

## 2023-08-24 DIAGNOSIS — M1711 Unilateral primary osteoarthritis, right knee: Secondary | ICD-10-CM | POA: Diagnosis not present

## 2023-08-30 DIAGNOSIS — M25552 Pain in left hip: Secondary | ICD-10-CM | POA: Diagnosis not present

## 2023-08-30 DIAGNOSIS — M6281 Muscle weakness (generalized): Secondary | ICD-10-CM | POA: Diagnosis not present

## 2023-09-05 DIAGNOSIS — M25552 Pain in left hip: Secondary | ICD-10-CM | POA: Diagnosis not present

## 2023-09-05 DIAGNOSIS — M6281 Muscle weakness (generalized): Secondary | ICD-10-CM | POA: Diagnosis not present

## 2023-09-08 DIAGNOSIS — R053 Chronic cough: Secondary | ICD-10-CM | POA: Diagnosis not present

## 2023-09-12 DIAGNOSIS — M25552 Pain in left hip: Secondary | ICD-10-CM | POA: Diagnosis not present

## 2023-09-12 DIAGNOSIS — M6281 Muscle weakness (generalized): Secondary | ICD-10-CM | POA: Diagnosis not present

## 2023-09-14 DIAGNOSIS — M25552 Pain in left hip: Secondary | ICD-10-CM | POA: Diagnosis not present

## 2023-09-14 DIAGNOSIS — M6281 Muscle weakness (generalized): Secondary | ICD-10-CM | POA: Diagnosis not present

## 2023-09-19 DIAGNOSIS — M25552 Pain in left hip: Secondary | ICD-10-CM | POA: Diagnosis not present

## 2023-09-19 DIAGNOSIS — M6281 Muscle weakness (generalized): Secondary | ICD-10-CM | POA: Diagnosis not present

## 2023-09-26 DIAGNOSIS — M25552 Pain in left hip: Secondary | ICD-10-CM | POA: Diagnosis not present

## 2023-09-26 DIAGNOSIS — M6281 Muscle weakness (generalized): Secondary | ICD-10-CM | POA: Diagnosis not present

## 2023-10-03 DIAGNOSIS — E1142 Type 2 diabetes mellitus with diabetic polyneuropathy: Secondary | ICD-10-CM | POA: Diagnosis not present

## 2023-10-03 DIAGNOSIS — F5104 Psychophysiologic insomnia: Secondary | ICD-10-CM | POA: Diagnosis not present

## 2023-10-03 DIAGNOSIS — E785 Hyperlipidemia, unspecified: Secondary | ICD-10-CM | POA: Diagnosis not present

## 2023-10-03 DIAGNOSIS — Z6829 Body mass index (BMI) 29.0-29.9, adult: Secondary | ICD-10-CM | POA: Diagnosis not present

## 2023-10-03 DIAGNOSIS — G250 Essential tremor: Secondary | ICD-10-CM | POA: Diagnosis not present

## 2023-10-03 DIAGNOSIS — R413 Other amnesia: Secondary | ICD-10-CM | POA: Diagnosis not present

## 2023-10-03 DIAGNOSIS — D509 Iron deficiency anemia, unspecified: Secondary | ICD-10-CM | POA: Diagnosis not present

## 2023-10-03 DIAGNOSIS — F418 Other specified anxiety disorders: Secondary | ICD-10-CM | POA: Diagnosis not present

## 2023-10-10 DIAGNOSIS — G250 Essential tremor: Secondary | ICD-10-CM | POA: Diagnosis not present

## 2023-10-10 DIAGNOSIS — E559 Vitamin D deficiency, unspecified: Secondary | ICD-10-CM | POA: Diagnosis not present

## 2023-10-10 DIAGNOSIS — G629 Polyneuropathy, unspecified: Secondary | ICD-10-CM | POA: Diagnosis not present

## 2023-10-31 DIAGNOSIS — Z961 Presence of intraocular lens: Secondary | ICD-10-CM | POA: Diagnosis not present

## 2023-10-31 DIAGNOSIS — E119 Type 2 diabetes mellitus without complications: Secondary | ICD-10-CM | POA: Diagnosis not present

## 2023-12-09 DIAGNOSIS — Z780 Asymptomatic menopausal state: Secondary | ICD-10-CM | POA: Diagnosis not present

## 2023-12-09 DIAGNOSIS — M8589 Other specified disorders of bone density and structure, multiple sites: Secondary | ICD-10-CM | POA: Diagnosis not present

## 2023-12-09 DIAGNOSIS — M810 Age-related osteoporosis without current pathological fracture: Secondary | ICD-10-CM | POA: Diagnosis not present

## 2023-12-09 DIAGNOSIS — Z1231 Encounter for screening mammogram for malignant neoplasm of breast: Secondary | ICD-10-CM | POA: Diagnosis not present

## 2023-12-09 DIAGNOSIS — Z13820 Encounter for screening for osteoporosis: Secondary | ICD-10-CM | POA: Diagnosis not present

## 2024-01-06 DIAGNOSIS — R053 Chronic cough: Secondary | ICD-10-CM | POA: Diagnosis not present

## 2024-01-06 DIAGNOSIS — R059 Cough, unspecified: Secondary | ICD-10-CM | POA: Diagnosis not present

## 2024-01-06 DIAGNOSIS — J0110 Acute frontal sinusitis, unspecified: Secondary | ICD-10-CM | POA: Diagnosis not present

## 2024-01-06 DIAGNOSIS — K219 Gastro-esophageal reflux disease without esophagitis: Secondary | ICD-10-CM | POA: Diagnosis not present

## 2024-01-10 DIAGNOSIS — E1140 Type 2 diabetes mellitus with diabetic neuropathy, unspecified: Secondary | ICD-10-CM | POA: Diagnosis not present

## 2024-01-10 DIAGNOSIS — G3184 Mild cognitive impairment, so stated: Secondary | ICD-10-CM | POA: Diagnosis not present

## 2024-01-10 DIAGNOSIS — G250 Essential tremor: Secondary | ICD-10-CM | POA: Diagnosis not present

## 2024-01-10 NOTE — Progress Notes (Signed)
 Out Patient Neurology Note [facility] [address] [place], [place]  [address] [phone] * FAX [phone]   Name:  Rachel Mata MRN:  [id number] DOB:  1945-08-18 Age:  78 y.o.  Subjective: She follows up regarding her essential tremor.  She has some mild cognitive problems.  She has been using the [facility] device on her right hand every day.  Sometimes she forgets to use it twice.  It has diminished her tremor to a mild extent.  She had a bad experience in the past with topiramate.  She has tried primidone and propranolol in the past without significant benefit.  She cannot recall the exact doses.  She has painful peripheral neuropathy.  She had borderline diabetes but her hemoglobin A1c has been improved recently with improving her diet.  She is unable to get much exercise.  Her [employer] activities of daily living questionnaire was repeated and will be sent into the [employer].     History:   Medical History[1]  Family History[2]  Social History:  reports that she has never smoked. She has never used smokeless tobacco. She reports current alcohol use. She reports that she does not use drugs.  Surgical History[3]   Problem List:  Problem List[4]  Current Medications:  Current Medications[5]    Allergies:  is allergic to penicillin.        PHYSICAL EXAM    Vitals: Blood pressure was 160/102.  Pulse was 78 and regular.   Weight:  Weights (last 90 days)     None        BMI: There is no height or weight on file to calculate BMI.  GENERAL: No acute distress.   Well nourished and well developed. Appears stated age.    She has some resting tremor of her right greater than left extremities which is worse when she holds her arms outstretched in front of her.  She needs a cane to walk.  She gives a fair history.  She is accompanied by her daughter.    ASSESSMENT/ PLAN    Assessment:    1.  Somewhat atypical essential tremor with some resting tremor.  She has had a modest improvement with the peripheral nerve stimulator as described above.  2.  Mild cognitive impairment.  3.  Painful neuropathy affecting lower extremities, possibly due to long history of borderline diabetes.  She has had this evaluated in the past.    Plan and Patient Instructions:   Patient Instructions  Take alpha lipoic acid 400 mg daily for neuropathy pain.  2.   Take propranolol LA 80 mg daily in the evening and losartan-hydrochlorothiazide in the morning.  3.   Check blood pressure every morning. Contact Dr. Huey if top number is less than 110.  4.   Continue Cala KIQ twice daily before meals, as directed  5.   Follow up in 3 months.  6.  Will refer her for evaluation for focused ultrasound therapy.  Her mild cognitive impairment may be a mild contraindication for this.  Follow up: Return in about 3 months (around 04/11/2024).       [1] Past Medical History: Diagnosis Date  . Hypertension   . Neuropathy   . Skin cancer   . Tremor   [2] Family History Problem Relation Name Age of Onset  . Emphysema Mother    . Glaucoma Father    . Diabetes Paternal Grandfather    [  3] Past Surgical History: Procedure Laterality Date  . BLADDER NECK SUSPENSION     Procedure: BLADDER NECK SLING  . GALLBLADDER SURGERY     Procedure: GALLBLADDER SURGERY  . HYSTERECTOMY      Procedure: HYSTERECTOMY  [4] Patient Active Problem List Diagnosis  . Stress incontinence of urine  . Subacute vulvitis  . Sensorineural hearing loss (SNHL) of both ears  . Asymmetrical hearing loss  [5]  Current Outpatient Medications:  .  ascorbic acid (VITAMIN C) 500 mg tablet, Take  by mouth., Disp: , Rfl:  .  Breyna 160-4.5 mcg/actuation inhaler, Inhale 2 puffs in the morning and 2 puffs before bedtime., Disp: , Rfl:  .  calcium carbonate-vit D3-min 600 mg calcium- 400 unit tab, Take  by mouth., Disp: ,  Rfl:  .  clotrimazole-betamethasone (LOTRISONE) lotion, Apply  topically 2 (two) times a day for 10 days., Disp: 30 mL, Rfl: 2 .  escitalopram (LEXAPRO) 20 mg tablet, Take 20 mg by mouth Once Daily., Disp: , Rfl:  .  esomeprazole (NexIUM) 40 mg DR capsule, Take 40 mg by mouth daily before breakfast., Disp: , Rfl:  .  linaCLOtide  (LINZESS ) 145 mcg cap capsule, Take 145 mcg by mouth., Disp: , Rfl:  .  losartan-hydroCHLOROthiazide (HYZAAR) 100-25 mg per tablet, Take 1 tablet by mouth Once Daily., Disp: , Rfl:  .  meloxicam (MOBIC) 15 mg tablet, TAKE 1 (ONE) TABLET BY MOUTH DAILY WITH FOOD, Disp: , Rfl: 3 .  ondansetron  (ZOFRAN -ODT) 4 mg disintegrating tablet, Dissolve 4 mg on tongue., Disp: , Rfl:  .  simvastatin (ZOCOR) 20 mg tablet, Take 20 mg by mouth nightly., Disp: , Rfl:  .  venlafaxine (EFFEXOR XR) 150 mg 24 hr capsule, Take 150 mg by mouth., Disp: , Rfl:  .  pantoprazole  (PROTONIX ) 40 mg EC tablet, Take 40 mg by mouth daily. (Patient not taking: Reported on 10/10/2023), Disp: , Rfl:  .  propranoloL (INDERAL LA) 80 mg 24 hr capsule, Take 1 capsule (80 mg total) by mouth daily., Disp: 30 capsule, Rfl: 5

## 2024-02-14 DIAGNOSIS — G250 Essential tremor: Secondary | ICD-10-CM | POA: Diagnosis not present

## 2024-02-22 DIAGNOSIS — R053 Chronic cough: Secondary | ICD-10-CM | POA: Diagnosis not present

## 2024-02-29 DIAGNOSIS — D485 Neoplasm of uncertain behavior of skin: Secondary | ICD-10-CM | POA: Diagnosis not present

## 2024-02-29 DIAGNOSIS — D225 Melanocytic nevi of trunk: Secondary | ICD-10-CM | POA: Diagnosis not present

## 2024-02-29 DIAGNOSIS — L814 Other melanin hyperpigmentation: Secondary | ICD-10-CM | POA: Diagnosis not present

## 2024-02-29 DIAGNOSIS — L821 Other seborrheic keratosis: Secondary | ICD-10-CM | POA: Diagnosis not present

## 2024-02-29 DIAGNOSIS — D2239 Melanocytic nevi of other parts of face: Secondary | ICD-10-CM | POA: Diagnosis not present

## 2024-03-30 DIAGNOSIS — M545 Low back pain, unspecified: Secondary | ICD-10-CM | POA: Diagnosis not present

## 2024-03-30 DIAGNOSIS — M47816 Spondylosis without myelopathy or radiculopathy, lumbar region: Secondary | ICD-10-CM | POA: Diagnosis not present

## 2024-03-30 DIAGNOSIS — Z8781 Personal history of (healed) traumatic fracture: Secondary | ICD-10-CM | POA: Diagnosis not present

## 2024-03-31 DIAGNOSIS — C44622 Squamous cell carcinoma of skin of right upper limb, including shoulder: Secondary | ICD-10-CM | POA: Diagnosis not present

## 2024-04-04 DIAGNOSIS — E1142 Type 2 diabetes mellitus with diabetic polyneuropathy: Secondary | ICD-10-CM | POA: Diagnosis not present

## 2024-04-04 DIAGNOSIS — D509 Iron deficiency anemia, unspecified: Secondary | ICD-10-CM | POA: Diagnosis not present

## 2024-04-04 DIAGNOSIS — E785 Hyperlipidemia, unspecified: Secondary | ICD-10-CM | POA: Diagnosis not present

## 2024-04-27 DIAGNOSIS — S0993XA Unspecified injury of face, initial encounter: Secondary | ICD-10-CM | POA: Diagnosis not present

## 2024-04-27 DIAGNOSIS — S0990XA Unspecified injury of head, initial encounter: Secondary | ICD-10-CM | POA: Diagnosis not present

## 2024-05-28 DIAGNOSIS — H40013 Open angle with borderline findings, low risk, bilateral: Secondary | ICD-10-CM | POA: Diagnosis not present
